# Patient Record
Sex: Female | Born: 1955
Health system: Southern US, Community
[De-identification: ages and names within clinical notes are randomized; demographics above are authoritative.]

## PROBLEM LIST (undated history)

## (undated) DIAGNOSIS — K219 Gastro-esophageal reflux disease without esophagitis: Secondary | ICD-10-CM

## (undated) DIAGNOSIS — G243 Spasmodic torticollis: Secondary | ICD-10-CM

## (undated) DIAGNOSIS — R112 Nausea with vomiting, unspecified: Secondary | ICD-10-CM

## (undated) DIAGNOSIS — Z9889 Other specified postprocedural states: Secondary | ICD-10-CM

## (undated) DIAGNOSIS — E041 Nontoxic single thyroid nodule: Secondary | ICD-10-CM

## (undated) DIAGNOSIS — L709 Acne, unspecified: Secondary | ICD-10-CM

## (undated) DIAGNOSIS — R42 Dizziness and giddiness: Secondary | ICD-10-CM

## (undated) HISTORY — PX: ABDOMINAL HYSTERECTOMY: SHX81

## (undated) HISTORY — DX: Gastro-esophageal reflux disease without esophagitis: K21.9

## (undated) HISTORY — PX: COLONOSCOPY: SHX174

## (undated) HISTORY — PX: MYOMECTOMY: SHX85

## (undated) HISTORY — DX: Dizziness and giddiness: R42

## (undated) HISTORY — DX: Acne, unspecified: L70.9

## (undated) HISTORY — PX: BUNIONECTOMY: SHX129

## (undated) HISTORY — PX: ECTOPIC PREGNANCY SURGERY: SHX613

## (undated) HISTORY — PX: WISDOM TOOTH EXTRACTION: SHX21

---

## 1999-02-25 ENCOUNTER — Other Ambulatory Visit: Admission: RE | Admit: 1999-02-25 | Discharge: 1999-02-25 | Payer: Self-pay | Admitting: Obstetrics and Gynecology

## 1999-09-30 ENCOUNTER — Encounter (INDEPENDENT_AMBULATORY_CARE_PROVIDER_SITE_OTHER): Payer: Self-pay | Admitting: Specialist

## 1999-09-30 ENCOUNTER — Inpatient Hospital Stay (HOSPITAL_COMMUNITY): Admission: RE | Admit: 1999-09-30 | Discharge: 1999-10-02 | Payer: Self-pay | Admitting: Obstetrics and Gynecology

## 2002-01-14 ENCOUNTER — Other Ambulatory Visit: Admission: RE | Admit: 2002-01-14 | Discharge: 2002-01-14 | Payer: Self-pay | Admitting: Obstetrics and Gynecology

## 2004-07-08 ENCOUNTER — Ambulatory Visit: Payer: Self-pay | Admitting: Internal Medicine

## 2005-09-22 ENCOUNTER — Ambulatory Visit: Payer: Self-pay | Admitting: Internal Medicine

## 2005-09-26 ENCOUNTER — Ambulatory Visit: Payer: Self-pay | Admitting: Internal Medicine

## 2006-02-02 ENCOUNTER — Encounter: Admission: RE | Admit: 2006-02-02 | Discharge: 2006-02-02 | Payer: Self-pay | Admitting: Obstetrics and Gynecology

## 2006-05-17 ENCOUNTER — Encounter: Admission: RE | Admit: 2006-05-17 | Discharge: 2006-05-17 | Payer: Self-pay | Admitting: Obstetrics and Gynecology

## 2006-06-19 ENCOUNTER — Ambulatory Visit: Payer: Self-pay | Admitting: Internal Medicine

## 2006-06-22 ENCOUNTER — Ambulatory Visit: Payer: Self-pay | Admitting: Internal Medicine

## 2006-07-20 ENCOUNTER — Ambulatory Visit: Payer: Self-pay | Admitting: Internal Medicine

## 2006-07-29 ENCOUNTER — Encounter: Admission: RE | Admit: 2006-07-29 | Discharge: 2006-07-29 | Payer: Self-pay | Admitting: Internal Medicine

## 2006-08-07 ENCOUNTER — Encounter: Admission: RE | Admit: 2006-08-07 | Discharge: 2006-08-07 | Payer: Self-pay | Admitting: Obstetrics and Gynecology

## 2007-07-22 ENCOUNTER — Ambulatory Visit: Payer: Self-pay | Admitting: Internal Medicine

## 2007-07-22 LAB — CONVERTED CEMR LAB
Albumin: 3.5 g/dL (ref 3.5–5.2)
Alkaline Phosphatase: 38 units/L — ABNORMAL LOW (ref 39–117)
BUN: 6 mg/dL (ref 6–23)
Bacteria, UA: NEGATIVE
Basophils Absolute: 0 10*3/uL (ref 0.0–0.1)
Basophils Relative: 0.2 % (ref 0.0–1.0)
Bilirubin Urine: NEGATIVE
Cholesterol: 195 mg/dL (ref 0–200)
Crystals: NEGATIVE
GFR calc non Af Amer: 81 mL/min
HDL: 76.6 mg/dL (ref >39.0)
Hemoglobin: 13.7 g/dL (ref 12.0–15.0)
LDL Cholesterol: 111 mg/dL — ABNORMAL HIGH (ref 0–99)
MCHC: 34.6 g/dL (ref 30.0–36.0)
Monocytes Absolute: 0.4 10*3/uL (ref 0.2–0.7)
Monocytes Relative: 7.2 % (ref 3.0–11.0)
Nitrite: NEGATIVE
Platelets: 352 10*3/uL (ref 150–400)
Potassium: 4.2 meq/L (ref 3.5–5.1)
RBC / HPF: NONE SEEN
RDW: 12.2 % (ref 11.5–14.6)
Sodium: 147 meq/L — ABNORMAL HIGH (ref 135–145)
Specific Gravity, Urine: 1.01 (ref 1.000–1.03)
TSH: 0.99 microintl units/mL (ref 0.35–5.50)
Total CHOL/HDL Ratio: 2.5
Triglycerides: 38 mg/dL (ref 0–149)
Urine Glucose: NEGATIVE mg/dL
Urobilinogen, UA: 0.2 (ref 0.0–1.0)
VLDL: 8 mg/dL (ref 0–40)
pH: 6.5 (ref 5.0–8.0)

## 2007-07-26 ENCOUNTER — Ambulatory Visit: Payer: Self-pay | Admitting: Internal Medicine

## 2007-07-26 ENCOUNTER — Encounter: Payer: Self-pay | Admitting: Internal Medicine

## 2007-07-26 DIAGNOSIS — L708 Other acne: Secondary | ICD-10-CM | POA: Insufficient documentation

## 2007-07-26 DIAGNOSIS — K219 Gastro-esophageal reflux disease without esophagitis: Secondary | ICD-10-CM

## 2007-07-26 DIAGNOSIS — G4700 Insomnia, unspecified: Secondary | ICD-10-CM | POA: Insufficient documentation

## 2007-08-05 ENCOUNTER — Telehealth: Payer: Self-pay | Admitting: Internal Medicine

## 2007-08-12 ENCOUNTER — Encounter: Admission: RE | Admit: 2007-08-12 | Discharge: 2007-08-12 | Payer: Self-pay | Admitting: Obstetrics and Gynecology

## 2007-11-22 ENCOUNTER — Ambulatory Visit: Payer: Self-pay | Admitting: Internal Medicine

## 2007-11-22 DIAGNOSIS — M542 Cervicalgia: Secondary | ICD-10-CM

## 2007-11-22 DIAGNOSIS — J309 Allergic rhinitis, unspecified: Secondary | ICD-10-CM | POA: Insufficient documentation

## 2007-11-26 ENCOUNTER — Telehealth: Payer: Self-pay | Admitting: Internal Medicine

## 2008-02-18 ENCOUNTER — Encounter: Payer: Self-pay | Admitting: Internal Medicine

## 2008-02-21 ENCOUNTER — Ambulatory Visit: Payer: Self-pay | Admitting: Internal Medicine

## 2008-02-21 ENCOUNTER — Telehealth: Payer: Self-pay | Admitting: Internal Medicine

## 2008-02-21 DIAGNOSIS — R509 Fever, unspecified: Secondary | ICD-10-CM

## 2008-02-21 DIAGNOSIS — M545 Low back pain: Secondary | ICD-10-CM

## 2008-02-21 DIAGNOSIS — R109 Unspecified abdominal pain: Secondary | ICD-10-CM

## 2008-02-26 ENCOUNTER — Ambulatory Visit: Payer: Self-pay | Admitting: Cardiology

## 2008-03-10 LAB — CONVERTED CEMR LAB
ALT: 10 units/L (ref 0–35)
AST: 13 units/L (ref 0–37)
Alkaline Phosphatase: 48 units/L (ref 39–117)
Bilirubin, Direct: 0.1 mg/dL (ref 0.0–0.3)
CO2: 29 meq/L (ref 19–32)
Chloride: 108 meq/L (ref 96–112)
Potassium: 4.2 meq/L (ref 3.5–5.1)
Sodium: 141 meq/L (ref 135–145)
TSH: 0.55 microintl units/mL (ref 0.35–5.50)
Total Bilirubin: 0.8 mg/dL (ref 0.3–1.2)

## 2008-04-21 ENCOUNTER — Telehealth: Payer: Self-pay | Admitting: Internal Medicine

## 2008-04-27 ENCOUNTER — Encounter: Payer: Self-pay | Admitting: Internal Medicine

## 2008-08-12 ENCOUNTER — Encounter: Admission: RE | Admit: 2008-08-12 | Discharge: 2008-08-12 | Payer: Self-pay | Admitting: Obstetrics and Gynecology

## 2008-09-09 ENCOUNTER — Ambulatory Visit: Payer: Self-pay | Admitting: Internal Medicine

## 2008-09-09 DIAGNOSIS — G510 Bell's palsy: Secondary | ICD-10-CM

## 2008-09-10 ENCOUNTER — Telehealth: Payer: Self-pay | Admitting: Family Medicine

## 2008-09-16 ENCOUNTER — Telehealth (INDEPENDENT_AMBULATORY_CARE_PROVIDER_SITE_OTHER): Payer: Self-pay | Admitting: *Deleted

## 2008-09-30 ENCOUNTER — Ambulatory Visit: Payer: Self-pay | Admitting: Internal Medicine

## 2008-09-30 DIAGNOSIS — R209 Unspecified disturbances of skin sensation: Secondary | ICD-10-CM

## 2008-11-27 ENCOUNTER — Ambulatory Visit: Payer: Self-pay | Admitting: Internal Medicine

## 2008-11-27 LAB — CONVERTED CEMR LAB
AST: 17 units/L (ref 0–37)
Alkaline Phosphatase: 46 units/L (ref 39–117)
Basophils Absolute: 0 10*3/uL (ref 0.0–0.1)
Bilirubin, Direct: 0.2 mg/dL (ref 0.0–0.3)
Chloride: 104 meq/L (ref 96–112)
Eosinophils Absolute: 0.1 10*3/uL (ref 0.0–0.7)
Eosinophils Relative: 2.2 % (ref 0.0–5.0)
GFR calc non Af Amer: 80 mL/min
HDL: 94.5 mg/dL (ref >39.0)
Hemoglobin, Urine: NEGATIVE
MCHC: 34.2 g/dL (ref 30.0–36.0)
MCV: 94.8 fL (ref 78.0–100.0)
Neutrophils Relative %: 61.1 % (ref 43.0–77.0)
Platelets: 300 10*3/uL (ref 150–400)
Potassium: 4 meq/L (ref 3.5–5.1)
Total Bilirubin: 0.9 mg/dL (ref 0.3–1.2)
Total CHOL/HDL Ratio: 2.1
Urine Glucose: NEGATIVE mg/dL
Urobilinogen, UA: 0.2 (ref 0.0–1.0)
VLDL: 5 mg/dL (ref 0–40)
Vit D, 25-Hydroxy: 35 ng/mL (ref 30–89)
WBC: 4.9 10*3/uL (ref 4.5–10.5)

## 2008-12-03 ENCOUNTER — Ambulatory Visit: Payer: Self-pay | Admitting: Internal Medicine

## 2009-01-05 ENCOUNTER — Telehealth (INDEPENDENT_AMBULATORY_CARE_PROVIDER_SITE_OTHER): Payer: Self-pay | Admitting: *Deleted

## 2009-01-06 ENCOUNTER — Encounter: Payer: Self-pay | Admitting: Internal Medicine

## 2009-01-08 ENCOUNTER — Encounter: Payer: Self-pay | Admitting: Internal Medicine

## 2009-04-22 ENCOUNTER — Telehealth: Payer: Self-pay | Admitting: Internal Medicine

## 2009-08-31 ENCOUNTER — Encounter: Admission: RE | Admit: 2009-08-31 | Discharge: 2009-08-31 | Payer: Self-pay | Admitting: Obstetrics and Gynecology

## 2009-10-08 ENCOUNTER — Telehealth: Payer: Self-pay | Admitting: Internal Medicine

## 2009-12-13 ENCOUNTER — Ambulatory Visit: Payer: Self-pay | Admitting: Internal Medicine

## 2009-12-13 LAB — CONVERTED CEMR LAB
AST: 17 units/L (ref 0–37)
Basophils Absolute: 0 10*3/uL (ref 0.0–0.1)
CO2: 30 meq/L (ref 19–32)
Chloride: 108 meq/L (ref 96–112)
Eosinophils Absolute: 0.1 10*3/uL (ref 0.0–0.7)
HCT: 39.8 % (ref 36.0–46.0)
HDL: 89.3 mg/dL (ref >39.00)
Ketones, ur: NEGATIVE mg/dL
LDL Cholesterol: 91 mg/dL (ref 0–99)
Leukocytes, UA: NEGATIVE
Lymphocytes Relative: 32.9 % (ref 12.0–46.0)
Lymphs Abs: 1.5 10*3/uL (ref 0.7–4.0)
MCHC: 33.6 g/dL (ref 30.0–36.0)
Monocytes Relative: 6.4 % (ref 3.0–12.0)
Nitrite: NEGATIVE
Platelets: 248 10*3/uL (ref 150.0–400.0)
RDW: 11.3 % — ABNORMAL LOW (ref 11.5–14.6)
Sodium: 140 meq/L (ref 135–145)
Specific Gravity, Urine: 1.025 (ref 1.000–1.030)
TSH: 0.92 microintl units/mL (ref 0.35–5.50)
Total Bilirubin: 0.8 mg/dL (ref 0.3–1.2)
Total CHOL/HDL Ratio: 2
Triglycerides: 40 mg/dL (ref 0.0–149.0)
Urobilinogen, UA: 0.2 (ref 0.0–1.0)
pH: 5.5 (ref 5.0–8.0)

## 2009-12-16 ENCOUNTER — Ambulatory Visit: Payer: Self-pay | Admitting: Internal Medicine

## 2009-12-22 ENCOUNTER — Encounter: Admission: RE | Admit: 2009-12-22 | Discharge: 2009-12-22 | Payer: Self-pay | Admitting: Internal Medicine

## 2010-07-06 ENCOUNTER — Telehealth: Payer: Self-pay | Admitting: Internal Medicine

## 2010-09-09 ENCOUNTER — Ambulatory Visit: Payer: Self-pay | Admitting: Internal Medicine

## 2010-09-09 ENCOUNTER — Encounter
Admission: RE | Admit: 2010-09-09 | Discharge: 2010-09-09 | Payer: Self-pay | Source: Home / Self Care | Attending: Obstetrics and Gynecology | Admitting: Obstetrics and Gynecology

## 2010-09-09 DIAGNOSIS — R5383 Other fatigue: Secondary | ICD-10-CM

## 2010-09-09 DIAGNOSIS — R5381 Other malaise: Secondary | ICD-10-CM

## 2010-09-09 DIAGNOSIS — R42 Dizziness and giddiness: Secondary | ICD-10-CM

## 2010-09-14 LAB — CONVERTED CEMR LAB
AST: 16 units/L (ref 0–37)
Albumin: 3.9 g/dL (ref 3.5–5.2)
Alkaline Phosphatase: 54 units/L (ref 39–117)
BUN: 12 mg/dL (ref 6–23)
Basophils Absolute: 0 10*3/uL (ref 0.0–0.1)
Blood, UA: NEGATIVE
CO2: 32 meq/L (ref 19–32)
Calcium: 9.4 mg/dL (ref 8.4–10.5)
Creatinine, Ser: 0.7 mg/dL (ref 0.4–1.2)
Eosinophils Absolute: 0.1 10*3/uL (ref 0.0–0.7)
GFR calc non Af Amer: 105.63 mL/min (ref >60.00)
Glucose, Bld: 89 mg/dL (ref 70–99)
Lymphocytes Relative: 37.1 % (ref 12.0–46.0)
MCHC: 34.3 g/dL (ref 30.0–36.0)
Monocytes Relative: 7.5 % (ref 3.0–12.0)
Nitrite: NEGATIVE
Platelets: 294 10*3/uL (ref 150.0–400.0)
RDW: 12.9 % (ref 11.5–14.6)
Sed Rate: 6 mm/hr (ref 0–22)
Specific Gravity, Urine: <=1.005 (ref 1.000–1.030)
Total Bilirubin: 0.8 mg/dL (ref 0.3–1.2)
Total CK: 115 units/L (ref 7–177)
Total Protein, Urine: NEGATIVE mg/dL
Urine Glucose: NEGATIVE mg/dL
Urobilinogen, UA: 0.2 (ref 0.0–1.0)

## 2010-09-16 ENCOUNTER — Encounter
Admission: RE | Admit: 2010-09-16 | Discharge: 2010-09-16 | Payer: Self-pay | Source: Home / Self Care | Attending: Obstetrics and Gynecology | Admitting: Obstetrics and Gynecology

## 2010-10-25 NOTE — Progress Notes (Signed)
Summary: Rf Ambien  Phone Note Refill Request Message from:  Fax from Pharmacy  Refills Requested: Medication #1:  ZOLPIDEM TARTRATE 10 MG  TABS 1/2 or 1 by mouth at hs prn   Dosage confirmed as above?Dosage Confirmed   Supply Requested: 30   Last Refilled: 06/10/2010  Method Requested: Telephone to Pharmacy Next Appointment Scheduled: None Initial call taken by: Lanier Prude, [facility]),  July 06, 2010 8:01 AM  Follow-up for Phone Call        ok 6 ref Follow-up by: Tresa Garter MD,  July 06, 2010 11:10 PM  Additional Follow-up for Phone Call Additional follow up Details #1::        Unable to call in at this time, pharm # not picking up.........Marland KitchenLamar Sprinkles, CMA  July 07, 2010 9:37 AM     Prescriptions: ZOLPIDEM TARTRATE 10 MG  TABS (ZOLPIDEM TARTRATE) 1/2 or 1 by mouth at hs prn  #30 x 5   Entered by:   Lamar Sprinkles, CMA   Authorized by:   Tresa Garter MD   Signed by:   Lamar Sprinkles, CMA on 07/07/2010   Method used:   Telephoned to ...       [facility] [phone][facility] (retail)       [address]       [place], [place]  [address]       Ph: [phone]       Fax: [phone]   RxID:   [id number]

## 2010-10-25 NOTE — Progress Notes (Signed)
Summary: zolpidem  Phone Note From Pharmacy Call back at fax   Caller: [facility] [phone]* Details for Reason: refill on zolpidem Details of Action Taken: ok x 2 refills  will fax to pharmacy/vg Initial call taken by: Tora Perches,  October 08, 2009 4:38 PM    Prescriptions: ZOLPIDEM TARTRATE 10 MG  TABS (ZOLPIDEM TARTRATE) 1/2 or 1 by mouth at hs prn  #30 x 2   Entered by:   Tora Perches   Authorized by:   Tresa Garter MD   Signed by:   Tora Perches on 10/08/2009   Method used:   Telephoned to ...       [facility] [phone]* (retail)       [address]       [place], [place]  [address]       Ph: [phone]       Fax: [phone]   RxID:   [id number]

## 2010-10-25 NOTE — Assessment & Plan Note (Signed)
Summary: CPX / [employer] / # / CD   Vital Signs:  Patient profile:   55 year old female Height:      64 inches Weight:      142 pounds BMI:     24.46 Temp:     97.4 degrees F oral Pulse rate:   94 / minute BP sitting:   106 / 72  (left arm)  Vitals Entered By: Tora Perches (December 16, 2009 8:33 AM) CC: cpx Is Patient Diabetic? No   CC:  cpx.  History of Present Illness: The patient presents for a wellness examination C/o pain in RUQ and R flank x 2 wks off and on before, constant over the wknd - dull ache  Preventive Screening-Counseling & Management  Alcohol-Tobacco     Smoking Status: never  Current Medications (verified): 1)  Zolpidem Tartrate 10 Mg  Tabs (Zolpidem Tartrate) .... 1/2 or 1 By Mouth At [facility] Prn 2)  Zyrtec Allergy 10 Mg Tabs (Cetirizine Hcl) 3)  Antivert 12.5 Mg Tabs (Meclizine Hcl) .Marland Kitchen.. 1-2 By Mouth Three Times A Day As Needed Vertigo 4)  Vitamin D3 1000 Unit  Tabs (Cholecalciferol) .Marland Kitchen.. 1 By Mouth Daily 5)  Vivelle-Dot 0.075 Mg/24hr Pttw (Estradiol) .... Two Times A Day 6)  Finacea 15 % Gel (Azelaic Acid) .Marland Kitchen.. 1-2 Times A Day 7)  Claritin Otc [facility] 8)  Suprema Bophilus .... Once Daily 9)  Benadryl 25 Mg Tabs (Diphenhydramine Hcl) .... Once Daily 10)  Finacea 15 % Gel (Azelaic Acid) .... As Needed  Allergies: 1)  ! Sulfa 2)  ! Zithromax (Azithromycin)  Past History:  Past Medical History: Last updated: 11/22/2007 GERD Acne chronic vertigo Allergic rhinitis  Past Surgical History: Last updated: 12/03/2008 Hysterectomy partial  Family History: Last updated: 02/21/2008 Family History Hypertension sister with brain tumor father with DM grandmother with pancreatic cancer M died 45 with throat CA  Social History: Last updated: 09/09/2008 Occupation: nursing Married Never Smoked Alcohol use-no Drug use-no Regular exercise-yes  Review of Systems       The patient complains of abdominal pain.  The patient denies anorexia, fever, weight  loss, weight gain, vision loss, decreased hearing, hoarseness, chest pain, syncope, dyspnea on exertion, peripheral edema, prolonged cough, headaches, hemoptysis, melena, hematochezia, severe indigestion/heartburn, hematuria, incontinence, genital sores, muscle weakness, suspicious skin lesions, transient blindness, difficulty walking, depression, unusual weight change, abnormal bleeding, enlarged lymph nodes, angioedema, and breast masses.    Physical Exam  General:  Well-developed,well-nourished,in no acute distress; alert,appropriate and cooperative throughout examination Head:  Normocephalic and atraumatic without obvious abnormalities. No apparent alopecia or balding. Eyes:  No corneal or conjunctival inflammation noted. EOMI. Perrla. Ears:  External ear exam shows no significant lesions or deformities.  Otoscopic examination reveals clear canals, tympanic membranes are intact bilaterally without bulging, retraction, inflammation or discharge. Hearing is grossly normal bilaterally. Nose:  External nasal examination shows no deformity or inflammation. Nasal mucosa are pink and moist without lesions or exudates. Mouth:  Oral mucosa and oropharynx without lesions or exudates.  Teeth in good repair. Neck:  No deformities, masses, or tenderness noted. Lungs:  Normal respiratory effort, chest expands symmetrically. Lungs are clear to auscultation, no crackles or wheezes. Heart:  Normal rate and regular rhythm. S1 and S2 normal without gallop, murmur, click, rub or other extra sounds. Abdomen:  soft, non-tender, normal bowel sounds, no distention, no masses, no guarding, no rigidity, no hepatomegaly, and no splenomegaly.   Msk:  normal ROM, no joint tenderness, no joint swelling,  and no joint warmth.   Pulses:  R and L carotid,radial,femoral,dorsalis pedis and posterior tibial pulses are full and equal bilaterally Extremities:  No clubbing, cyanosis, edema, or deformity noted with normal full range of  motion of all joints.   Neurologic:  No cranial nerve deficits noted. Station and gait are normal. Plantar reflexes are down-going bilaterally. DTRs are symmetrical throughout. Sensory, motor and coordinative functions appear intact. Skin:  Intact without suspicious lesions or rashes Cervical Nodes:  No lymphadenopathy noted Inguinal Nodes:  No significant adenopathy Psych:  Cognition and judgment appear intact. Alert and cooperative with normal attention span and concentration. No apparent delusions, illusions, hallucinations   Impression & Recommendations:  Problem # 1:  WELL ADULT EXAM (ICD-V70.0) Assessment New Health and age related issues were discussed. Available screening tests and vaccinations were discussed as well. Healthy life style including good diet and execise was discussed.  The labs were reviewed with the patient.  Orders: EKG w/ Interpretation (93000) nl GYN q 12 months   Problem # 2:  LOW BACK PAIN (ICD-724.2) Assessment: New  The following medications were removed from the medication list:    Baby Aspirin 81 Mg Chew (Aspirin) ..... Once daily  Problem # 3:  ABDOMINAL PAIN (ICD-789.00) Assessment: New  Orders: Radiology Referral (Radiology) [place] The abd CT from 2009 was reviewed with the patient.   Problem # 4:  INSOMNIA, PERSISTENT (ICD-307.42) Assessment: Unchanged On prescription/OTC  therapy   Complete Medication List: 1)  Zolpidem Tartrate 10 Mg Tabs (Zolpidem tartrate) .... 1/2 or 1 by mouth at hs prn 2)  Zyrtec Allergy 10 Mg Tabs (Cetirizine hcl) 3)  Antivert 12.5 Mg Tabs (Meclizine hcl) .Marland Kitchen.. 1-2 by mouth three times a day as needed vertigo 4)  Vitamin D3 1000 Unit Tabs (Cholecalciferol) .Marland Kitchen.. 1 by mouth daily 5)  Vivelle-dot 0.075 Mg/24hr Pttw (Estradiol) .... Two times a day 6)  Finacea 15 % Gel (Azelaic acid) .Marland Kitchen.. 1-2 times a day 7)  Claritin Otc [facility]  8)  Suprema Bophilus  .... Once daily 9)  Benadryl 25 Mg Tabs (Diphenhydramine hcl) ....  Once daily 10)  Finacea 15 % Gel (Azelaic acid) .... As needed 11)  Ciprofloxacin Hcl 500 Mg Tabs (Ciprofloxacin hcl) .Marland Kitchen.. 1 by mouth bid  Patient Instructions: 1)  Please schedule a follow-up appointment in 1 year. 2)  Try to eat more raw plant food, fresh and dry fruit, raw almonds, leafy vegetables, whole foods and less red meat, less animal fat. Poultry and fish is better for you than pork and beef. Avoid processed foods (canned soups, hot dogs, sausage, bacon , frozen dinners). Avoid corn syrup, high fructose syrup or aspartam and Splenda  containing drinks. Honey, Agave and Stevia are better sweeteners. Make your own  dressing with olive oil, wine vinegar, lemon juce, garlic etc. for your salads.  Prescriptions: CIPROFLOXACIN HCL 500 MG TABS (CIPROFLOXACIN HCL) 1 by mouth bid  #20 x 1   Entered and Authorized by:   Tresa Garter MD   Signed by:   Tresa Garter MD on 12/16/2009   Method used:   Print then Give to Patient   RxID:   [id number] ZOLPIDEM TARTRATE 10 MG  TABS (ZOLPIDEM TARTRATE) 1/2 or 1 by mouth at hs prn  #30 x 6   Entered and Authorized by:   Tresa Garter MD   Signed by:   Tresa Garter MD on 12/16/2009   Method used:   Print then Give to Patient  RxID:   [id number]

## 2010-10-27 NOTE — Assessment & Plan Note (Signed)
Summary: dizziness and joint weakness-lb   Vital Signs:  Patient profile:   55 year old female Height:      64 inches Weight:      140 pounds BMI:     24.12 Temp:     98.5 degrees F oral Pulse rate:   68 / minute Pulse rhythm:   regular Resp:     16 per minute BP sitting:   120 / 88  (left arm) Cuff size:   regular  Vitals Entered By: Lanier Prude, Beverly Gust) (September 09, 2010 4:20 PM) CC: dizziness X 1 mo and legs "feel like lead" Is Patient Diabetic? No   CC:  dizziness X 1 mo and legs "feel like lead".  History of Present Illness: C/o vertigo worse with turning her head C/o legs feel like lead x 2 wks  Current Medications (verified): 1)  Zolpidem Tartrate 10 Mg  Tabs (Zolpidem Tartrate) .... 1/2 or 1 By Mouth At [facility] Prn 2)  Zyrtec Allergy 10 Mg Tabs (Cetirizine Hcl) 3)  Antivert 12.5 Mg Tabs (Meclizine Hcl) .Marland Kitchen.. 1-2 By Mouth Three Times A Day As Needed Vertigo 4)  Vitamin D3 1000 Unit  Tabs (Cholecalciferol) .Marland Kitchen.. 1 By Mouth Daily 5)  Vivelle-Dot 0.075 Mg/24hr Pttw (Estradiol) .... Two Times A Day 6)  Finacea 15 % Gel (Azelaic Acid) .Marland Kitchen.. 1-2 Times A Day 7)  Claritin Otc [facility] 8)  Suprema Bophilus .... Once Daily 9)  Benadryl 25 Mg Tabs (Diphenhydramine Hcl) .... Once Daily 10)  Finacea 15 % Gel (Azelaic Acid) .... As Needed  Allergies (verified): 1)  ! Sulfa 2)  ! 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    Physical Exam  General:  Well-developed,well-nourished,in no acute distress; alert,appropriate and cooperative throughout examination Eyes:   No corneal or conjunctival inflammation noted. EOMI. Perrla. Nose:  External nasal examination shows no deformity or inflammation. Nasal mucosa are pink and moist without lesions or exudates. Mouth:  Oral mucosa and oropharynx without lesions or exudates.  Teeth in good repair. Lungs:  Normal respiratory effort, chest expands symmetrically. Lungs are clear to auscultation, no crackles or wheezes. Heart:  Normal rate and regular rhythm. S1 and S2 normal without gallop, murmur, click, rub or other extra sounds. Abdomen:  soft, non-tender, normal bowel sounds, no distention, no masses, no guarding, no rigidity, no hepatomegaly, and no splenomegaly.   Msk:  normal ROM, no joint tenderness, no joint swelling, and no joint warmth.   Neurologic:  No cranial nerve deficits noted. Station and gait are normal. Plantar reflexes are down-going bilaterally. DTRs are symmetrical throughout. Sensory, motor and coordinative functions appear intact. H-P (+) on L Skin:  Intact without suspicious lesions or rashes Psych:  Cognition and judgment appear intact. Alert and cooperative with normal attention span and concentration. No apparent delusions, illusions, hallucinations   Impression & Recommendations:  Problem # 1:  WEAKNESS (ICD-780.79) ? etiol Assessment New  Orders: TLB-B12, Serum-Total ONLY (04540-J81) TLB-BMP (Basic Metabolic Panel-BMET) (80048-METABOL) TLB-CBC Platelet - w/Differential (85025-CBCD) TLB-Hepatic/Liver Function Pnl (80076-HEPATIC) TLB-Sedimentation Rate (ESR) (85652-ESR) TLB-TSH (Thyroid Stimulating Hormone) (84443-TSH) TLB-Udip ONLY (81003-UDIP) TLB-CK Total Only(Creatine Kinase/CPK) (82550-CK)  Problem # 2:  VERTIGO (ICD-780.4) BPV Assessment:  Allayne Stack - Daroff exercise was given to the patient  Her updated medication list for this problem includes:    Zyrtec Allergy 10 Mg Tabs (Cetirizine hcl)    Antivert 12.5 Mg Tabs (Meclizine hcl) .Marland Kitchen... 1-2 by mouth three times a day as  needed vertigo    Benadryl 25 Mg Tabs (Diphenhydramine hcl) ..... Once daily  Problem # 3:  PARESTHESIA (ICD-782.0) Assessment: Improved Resolved  Complete Medication List: 1)  Zolpidem Tartrate 10 Mg Tabs (Zolpidem tartrate) .... 1/2 or 1 by mouth at hs prn 2)  Zyrtec Allergy 10 Mg Tabs (Cetirizine hcl) 3)  Antivert 12.5 Mg Tabs (Meclizine hcl) .Marland Kitchen.. 1-2 by mouth three times a day as needed vertigo 4)  Vitamin D3 1000 Unit Tabs (Cholecalciferol) .Marland Kitchen.. 1 by mouth daily 5)  Vivelle-dot 0.075 Mg/24hr Pttw (Estradiol) .... Two times a day 6)  Finacea 15 % Gel (Azelaic acid) .Marland Kitchen.. 1-2 times a day 7)  Claritin Otc [facility]  8)  Suprema Bophilus  .... Once daily 9)  Benadryl 25 Mg Tabs (Diphenhydramine hcl) .... Once daily 10)  Finacea 15 % Gel (Azelaic acid) .... As needed  Patient Instructions: 1)  Francee Piccolo - Daroff exercise  2)  Call if you are not better in a reasonable amount of time or if worse.  Prescriptions: ANTIVERT 12.5 MG TABS (MECLIZINE HCL) 1-2 by mouth three times a day as needed vertigo  #60 x 1   Entered and Authorized by:   Tresa Garter MD   Signed by:   Tresa Garter MD on 09/09/2010   Method used:   Electronically to        [facility] [phone]* (retail)       [address]       [place], [place]  [address]       Ph: [phone]       Fax: [phone]   RxID:   [id number]    Orders Added: 1)  TLB-B12, Serum-Total ONLY [82607-B12] 2)  TLB-BMP (Basic Metabolic Panel-BMET) [80048-METABOL] 3)  TLB-CBC Platelet - w/Differential [85025-CBCD] 4)  TLB-Hepatic/Liver Function Pnl [80076-HEPATIC] 5)  TLB-Sedimentation Rate (ESR) [85652-ESR] 6)  TLB-TSH (Thyroid Stimulating Hormone) [84443-TSH] 7)  TLB-Udip ONLY [81003-UDIP] 8)  TLB-CK Total Only(Creatine Kinase/CPK) [82550-CK] 9)  Est. Patient Level IV [24401]

## 2010-11-01 ENCOUNTER — Ambulatory Visit (INDEPENDENT_AMBULATORY_CARE_PROVIDER_SITE_OTHER): Payer: Managed Care, Other (non HMO) | Admitting: Internal Medicine

## 2010-11-01 ENCOUNTER — Encounter: Payer: Self-pay | Admitting: Internal Medicine

## 2010-11-01 DIAGNOSIS — R079 Chest pain, unspecified: Secondary | ICD-10-CM

## 2010-11-01 DIAGNOSIS — M940 Chondrocostal junction syndrome [Tietze]: Secondary | ICD-10-CM

## 2010-11-01 DIAGNOSIS — Z136 Encounter for screening for cardiovascular disorders: Secondary | ICD-10-CM

## 2010-11-10 NOTE — Assessment & Plan Note (Signed)
Summary: rib pain/plot pt   Vital Signs:  Patient profile:   55 year old female Height:      64 inches (162.56 cm) O2 Sat:      98 % on Room air Temp:     98.6 degrees F (37.00 degrees C) oral Pulse rate:   63 / minute BP sitting:   120 / 72  (left arm) Cuff size:   regular  Vitals Entered By: Orlan Leavens RMA (November 01, 2010 1:19 PM)  O2 Flow:  Room air CC: Rib pain Is Patient Diabetic? No Pain Assessment Patient in pain? yes     Location: rib area Type: aching   Primary Care Provider:  Tresa Garter MD  CC:  Rib pain.  History of Present Illness:       This is a 55 year old female who presents with Chest pain.  The symptoms began 4 weeks ago.  On a scale of 1 to 10, the intensity is described as a 5.  no prior cardiac history.  The patient reports resting chest pain and exertional chest pain, but denies nausea, vomiting, diaphoresis, shortness of breath, palpitations, dizziness, light headedness, syncope, and indigestion.  The pain is described as constant and sharp.  The pain is located in the left anterior chest and the pain does not radiate.  Episodes of chest pain last >30 minutes.  The pain is brought on or made worse by upper body movement.  The pain is relieved or improved with NSAIDs and change in position.  +history similar pain when dx yeasr ago with costochondritits.  Clinical Review Panels:  Lipid Management   Cholesterol:  188 (12/13/2009)   LDL (bad choesterol):  91 (12/13/2009)   HDL (good cholesterol):  89.30 (12/13/2009)  CBC   WBC:  6.0 (09/09/2010)   RBC:  4.19 (09/09/2010)   Hgb:  13.6 (09/09/2010)   Hct:  39.7 (09/09/2010)   Platelets:  294.0 (09/09/2010)   MCV  94.9 (09/09/2010)   MCHC  34.3 (09/09/2010)   RDW  12.9 (09/09/2010)   PMN:  52.4 (09/09/2010)   Lymphs:  37.1 (09/09/2010)   Monos:  7.5 (09/09/2010)   Eosinophils:  2.5 (09/09/2010)   Basophil:  0.5 (09/09/2010)  Complete Metabolic Panel   Glucose:  89 (09/09/2010)  Sodium:  139 (09/09/2010)   Potassium:  3.8 (09/09/2010)   Chloride:  102 (09/09/2010)   CO2:  32 (09/09/2010)   BUN:  12 (09/09/2010)   Creatinine:  0.7 (09/09/2010)   Albumin:  3.9 (09/09/2010)   Total Protein:  6.4 (09/09/2010)   Calcium:  9.4 (09/09/2010)   Total Bili:  0.8 (09/09/2010)   Alk Phos:  54 (09/09/2010)   SGPT (ALT):  16 (09/09/2010)   SGOT (AST):  16 (09/09/2010)   Current Medications (verified): 1)  Zolpidem Tartrate 10 Mg  Tabs (Zolpidem Tartrate) .... 1/2 or 1 By Mouth At [facility] Prn 2)  Zyrtec Allergy 10 Mg Tabs (Cetirizine Hcl) 3)  Antivert 12.5 Mg Tabs (Meclizine Hcl) .Marland Kitchen.. 1-2 By Mouth Three Times A Day As Needed Vertigo 4)  Vitamin D3 1000 Unit  Tabs (Cholecalciferol) .Marland Kitchen.. 1 By Mouth Daily 5)  Vivelle-Dot 0.075 Mg/24hr Pttw (Estradiol) .... Two Times A Day 6)  Finacea 15 % Gel (Azelaic Acid) .Marland Kitchen.. 1-2 Times A Day 7)  Claritin Otc [facility] 8)  Suprema Bophilus .... Once Daily 9)  Benadryl 25 Mg Tabs (Diphenhydramine Hcl) .... Once Daily As Needed  Allergies (verified): 1)  ! Sulfa 2)  !  Zithromax (Azithromycin)  Past History:  Past Medical History: GERD Acne chronic vertigo Allergic rhinitis   Review of Systems  The patient denies fever, weight loss, syncope, dyspnea on exertion, peripheral edema, headaches, abdominal pain, and severe indigestion/heartburn.    Physical Exam  General:  alert, well-developed, well-nourished, and cooperative to examination.   nontoxic Eyes:  vision grossly intact; pupils equal, round and reactive to light.  conjunctiva and lids normal.    Neck:  supple, full ROM, no masses, no thyromegaly; no thyroid nodules or tenderness. no JVD or carotid bruits.   Chest Wall:  tender along left costal margin to palp Lungs:  normal respiratory effort, no intercostal retractions or use of accessory muscles; normal breath sounds bilaterally - no crackles and no wheezes.    Heart:  normal rate, regular rhythm, no murmur, and no rub. BLE  without edema.  Skin:  no rashes, vesicles, ulcers, or erythema. No nodules or irregularity to palpation.    Impression & Recommendations:  Problem # 1:  CHEST PAIN (ICD-786.50)  suspect costochondritis on exam - EKG today for reassurance - NSR, no ischemia tx 7 day NSAIDS - diclofenac erx done if cont symptoms s/p tx, to call for cxr or other eval, call sooner if worse - pt understands and agrees  Orders: EKG w/ Interpretation (93000)  Problem # 2:  COSTOCHONDRITIS (ICD-733.6)  Discussed medication use, applications of heat or ice, and exercises.   Orders: Prescription Created Electronically [phone])  Complete Medication List: 1)  Zolpidem Tartrate 10 Mg Tabs (Zolpidem tartrate) .... 1/2 or 1 by mouth at hs prn 2)  Zyrtec Allergy 10 Mg Tabs (Cetirizine hcl) 3)  Antivert 12.5 Mg Tabs (Meclizine hcl) .Marland Kitchen.. 1-2 by mouth three times a day as needed vertigo 4)  Vitamin D3 1000 Unit Tabs (Cholecalciferol) .Marland Kitchen.. 1 by mouth daily 5)  Vivelle-dot 0.075 Mg/24hr Pttw (Estradiol) .... Two times a day 6)  Finacea 15 % Gel (Azelaic acid) .Marland Kitchen.. 1-2 times a day 7)  Claritin Otc [facility]  8)  Suprema Bophilus  .... Once daily 9)  Benadryl 25 Mg Tabs (Diphenhydramine hcl) .... Once daily as needed 10)  Diclofenac Sodium 75 Mg Tbec (Diclofenac sodium) .Marland Kitchen.. 1 by mouth two times a day x 7 days, then as needed for pain  Patient Instructions: 1)  it was good to see you today. 2)  exam consistent with costochondritis - EKG today shows no cardiac abnormalities -  3)  treat with antiinflammatory scheduled as discussed - diclofenac - take with food - your prescription has been electronically submitted to your pharmacy. Please take as directed. Contact our office if you believe you're having problems with the medication(s).  4)  Return in 7-10 days if you're not better:call sooner if you're feeling worse. Prescriptions: DICLOFENAC SODIUM 75 MG TBEC (DICLOFENAC SODIUM) 1 by mouth two times a day x 7 days,  then as needed for pain  #20 x 0   Entered and Authorized by:   Newt Lukes MD   Signed by:   Newt Lukes MD on 11/01/2010   Method used:   Electronically to        [facility] [phone]* (retail)       [address]       [place], [place]  [address]       Ph: [phone]       Fax: [phone]   RxID:   [id number]  Orders Added: 1)  Est. Patient Level IV [91478] 2)  Prescription Created Electronically [G8553] 3)  EKG w/ Interpretation [93000]

## 2011-01-10 ENCOUNTER — Telehealth: Payer: Self-pay | Admitting: Internal Medicine

## 2011-01-10 NOTE — Telephone Encounter (Signed)
OK to fill this prescription with additional refills x3 Thank you!  

## 2011-01-10 NOTE — Telephone Encounter (Signed)
Needing Rx for [generic Ambien] Zolpidem [phone]

## 2011-01-13 MED ORDER — ZOLPIDEM TARTRATE 10 MG PO TABS: 5.0000 mg | ORAL_TABLET | Freq: Every evening | ORAL | Status: DC | PRN

## 2011-01-13 NOTE — Telephone Encounter (Signed)
Rx Done . 

## 2011-02-07 ENCOUNTER — Other Ambulatory Visit: Payer: Self-pay | Admitting: Internal Medicine

## 2011-02-10 NOTE — Assessment & Plan Note (Signed)
[facility]                             PRIMARY CARE OFFICE NOTE   NAME:May May MRUK                        MRN:          [id number]  DATE:06/22/2006                            DOB:          12-01-1955    The patient is a 55 year old female who presents for a wellness examination.   ALLERGIES:  SULFA.   PAST MEDICAL HISTORY, FAMILY HISTORY, SOCIAL HISTORY:  As per 07/14/2004  note.  Had a left bunion surgery recently, successful.   CURRENT MEDICATIONS:  Reviewed with the patient.   REVIEW OF SYSTEMS:  Negative for chest pain, shortness of breath.  No  syncope.  No neurological complaints.  Not depressed, occasional trouble  sleeping the rest is negative.   PHYSICAL EXAMINATION:  Blood pressure 116/72, pulse 66, temperature 98.4,  weight 142 pounds.  Looks well.  HEENT:  Moist mucosa.  NECK:  Supple.  No thyromegaly or bruits.  LUNGS:  Clear to auscultation and percussion.  No wheeze or rales.  HEART:  S1-S2 no murmurs no gallops.  ABDOMEN:  Soft, nontender, without __________.  EXTREMITIES:  No edema.  NEUROLOGIC:  She is alert and appropriate, denies any depression.  SKIN:  Clear.   LABS:  On 01/17/2006 CBC normal.  CMET normal.  Cholesterol 165.  LDL 103.  TSH normal.  Urinalysis normal.   ASSESSMENT AND PLAN:  1. Normal wellness examination. Age/health related issues discussed.      Healthy lifestyle discussed.  Regular GYN care with Dr. Rosalio May.      Calcium and vitamin D. To      continue with mammogram once every 1-2 years.  2. Insomnia.  Ambien 10 mg at h.s. p.r.n. #30 with 6 refills.            ______________________________  Georgina Quint Plotnikov, MD      AVP/MedQ  DD:  06/28/2006  DT:  06/29/2006  Job #:  [id number]   cc:   May May, M.D.

## 2011-03-22 ENCOUNTER — Telehealth: Payer: Self-pay | Admitting: *Deleted

## 2011-03-22 NOTE — Telephone Encounter (Signed)
Pt was seen 10/2010 w/Dr Felicity Coyer for rib pain. Pain has continued and she would like xray w/o OV. Please advise.

## 2011-03-22 NOTE — Telephone Encounter (Signed)
Ok CXR OV if cont sx Thx

## 2011-03-23 ENCOUNTER — Ambulatory Visit (INDEPENDENT_AMBULATORY_CARE_PROVIDER_SITE_OTHER)
Admission: RE | Admit: 2011-03-23 | Discharge: 2011-03-23 | Disposition: A | Discharge status: Home / Self Care | Payer: Managed Care, Other (non HMO) | Source: Ambulatory Visit | Attending: Internal Medicine | Admitting: Internal Medicine

## 2011-03-23 DIAGNOSIS — R079 Chest pain, unspecified: Secondary | ICD-10-CM

## 2011-03-23 NOTE — Telephone Encounter (Signed)
Patient informed - order entered.

## 2011-03-23 NOTE — Telephone Encounter (Signed)
Left mess to call office back.   

## 2011-03-24 ENCOUNTER — Telehealth: Payer: Self-pay | Admitting: Internal Medicine

## 2011-03-24 NOTE — Telephone Encounter (Signed)
Tracy May, please, inform patient that her CXR was nl OV if sick Thx

## 2011-03-27 NOTE — Telephone Encounter (Signed)
Pt informed

## 2011-07-28 ENCOUNTER — Other Ambulatory Visit: Payer: Self-pay | Admitting: *Deleted

## 2011-07-28 NOTE — Telephone Encounter (Signed)
Last seen 09/09/10, last filled 01/13/11 #30 w/5 RF.  Is this OK to fill?  Please advise.

## 2011-08-01 ENCOUNTER — Telehealth: Payer: Self-pay | Admitting: *Deleted

## 2011-08-01 MED ORDER — ZOLPIDEM TARTRATE 10 MG PO TABS: 5.0000 mg | ORAL_TABLET | Freq: Every evening | ORAL | Status: DC | PRN

## 2011-08-01 NOTE — Telephone Encounter (Signed)
error 

## 2011-08-25 ENCOUNTER — Other Ambulatory Visit: Payer: Self-pay | Admitting: Obstetrics and Gynecology

## 2011-08-25 DIAGNOSIS — Z1231 Encounter for screening mammogram for malignant neoplasm of breast: Secondary | ICD-10-CM

## 2011-09-15 ENCOUNTER — Other Ambulatory Visit (INDEPENDENT_AMBULATORY_CARE_PROVIDER_SITE_OTHER): Payer: Managed Care, Other (non HMO)

## 2011-09-15 ENCOUNTER — Other Ambulatory Visit: Payer: Self-pay | Admitting: Internal Medicine

## 2011-09-15 ENCOUNTER — Encounter: Payer: Self-pay | Admitting: Internal Medicine

## 2011-09-15 ENCOUNTER — Other Ambulatory Visit: Payer: Self-pay | Admitting: *Deleted

## 2011-09-15 DIAGNOSIS — Z0000 Encounter for general adult medical examination without abnormal findings: Secondary | ICD-10-CM

## 2011-09-15 LAB — CBC WITH DIFFERENTIAL/PLATELET
Basophils Absolute: 0 10*3/uL (ref 0.0–0.1)
Eosinophils Absolute: 0.1 10*3/uL (ref 0.0–0.7)
Lymphocytes Relative: 34.9 % (ref 12.0–46.0)
MCHC: 34.2 g/dL (ref 30.0–36.0)
Neutrophils Relative %: 55.2 % (ref 43.0–77.0)
RBC: 4.26 Mil/uL (ref 3.87–5.11)
RDW: 12.9 % (ref 11.5–14.6)

## 2011-09-15 LAB — LIPID PANEL
HDL: 106.6 mg/dL (ref >39.00)
Triglycerides: 36 mg/dL (ref 0.0–149.0)

## 2011-09-15 LAB — URINALYSIS, ROUTINE W REFLEX MICROSCOPIC
Bilirubin Urine: NEGATIVE
Leukocytes, UA: NEGATIVE
Nitrite: NEGATIVE
Specific Gravity, Urine: 1.015 (ref 1.000–1.030)
pH: 7.5 (ref 5.0–8.0)

## 2011-09-15 LAB — HEPATIC FUNCTION PANEL: Albumin: 4 g/dL (ref 3.5–5.2)

## 2011-09-15 LAB — BASIC METABOLIC PANEL
BUN: 14 mg/dL (ref 6–23)
Calcium: 9.1 mg/dL (ref 8.4–10.5)
GFR: 92.17 mL/min (ref >60.00)
Glucose, Bld: 86 mg/dL (ref 70–99)

## 2011-09-20 ENCOUNTER — Ambulatory Visit (INDEPENDENT_AMBULATORY_CARE_PROVIDER_SITE_OTHER): Payer: Managed Care, Other (non HMO) | Admitting: Internal Medicine

## 2011-09-20 ENCOUNTER — Ambulatory Visit: Payer: Managed Care, Other (non HMO)

## 2011-09-20 ENCOUNTER — Encounter: Payer: Self-pay | Admitting: Internal Medicine

## 2011-09-20 VITALS — BP 110/80 | Temp 98.3°F | Wt 151.0 lb

## 2011-09-20 DIAGNOSIS — Z0000 Encounter for general adult medical examination without abnormal findings: Secondary | ICD-10-CM

## 2011-09-20 DIAGNOSIS — R42 Dizziness and giddiness: Secondary | ICD-10-CM

## 2011-09-20 DIAGNOSIS — Z23 Encounter for immunization: Secondary | ICD-10-CM

## 2011-09-20 DIAGNOSIS — Z136 Encounter for screening for cardiovascular disorders: Secondary | ICD-10-CM

## 2011-09-20 MED ORDER — MECLIZINE HCL 12.5 MG PO TABS: 12.5000 mg | ORAL_TABLET | Freq: Three times a day (TID) | ORAL | Status: DC | PRN

## 2011-09-20 MED ORDER — ZOLPIDEM TARTRATE 10 MG PO TABS: 5.0000 mg | ORAL_TABLET | Freq: Every evening | ORAL | Status: DC | PRN

## 2011-09-20 NOTE — Assessment & Plan Note (Signed)
We discussed age appropriate health related issues, including available/recomended screening tests and vaccinations. We discussed a need for adhering to healthy diet and exercise. Labs/EKG were reviewed/ordered. All questions were answered.   

## 2011-09-20 NOTE — Assessment & Plan Note (Signed)
2011 - chronic by now (2012) ENT consult

## 2011-09-20 NOTE — Progress Notes (Signed)
  Subjective:    Patient ID: Tracy May, female    DOB: Feb 08, 1956, 55 y.o.   MRN: [id number]  HPI  The patient is here for a wellness exam. The patient has been doing well overall without major physical or psychological issues going on lately. Review of Systems  Constitutional: Negative for chills, activity change, appetite change, fatigue and unexpected weight change.  HENT: Negative for congestion, mouth sores and sinus pressure.   Eyes: Negative for visual disturbance.  Respiratory: Negative for cough and chest tightness.   Gastrointestinal: Negative for nausea and abdominal pain.  Genitourinary: Negative for frequency, difficulty urinating and vaginal pain.  Musculoskeletal: Negative for back pain and gait problem.  Skin: Negative for pallor and rash.  Neurological: Negative for dizziness, tremors, weakness, numbness and headaches.  Psychiatric/Behavioral: Negative for confusion and sleep disturbance. The patient is not nervous/anxious.        Objective:   Physical Exam  Constitutional: She appears well-developed. No distress.  HENT:  Head: Normocephalic.  Right Ear: External ear normal.  Left Ear: External ear normal.  Nose: Nose normal.  Mouth/Throat: Oropharynx is clear and moist.  Eyes: Conjunctivae are normal. Pupils are equal, round, and reactive to light. Right eye exhibits no discharge. Left eye exhibits no discharge.  Neck: Normal range of motion. Neck supple. No JVD present. No tracheal deviation present. No thyromegaly present.  Cardiovascular: Normal rate, regular rhythm and normal heart sounds.   Pulmonary/Chest: No stridor. No respiratory distress. She has no wheezes.  Abdominal: Soft. Bowel sounds are normal. She exhibits no distension and no mass. There is no tenderness. There is no rebound and no guarding.  Musculoskeletal: She exhibits no edema and no tenderness.  Lymphadenopathy:    She has no cervical adenopathy.  Neurological: She displays normal  reflexes. No cranial nerve deficit. She exhibits normal muscle tone. Coordination normal.  Skin: No rash noted. No erythema.  Psychiatric: She has a normal mood and affect. Her behavior is normal. Judgment and thought content normal.    Lab Results  Component Value Date   WBC 4.9 09/15/2011   HGB 13.9 09/15/2011   HCT 40.6 09/15/2011   PLT 312.0 09/15/2011   GLUCOSE 86 09/15/2011   CHOL 239* 09/15/2011   TRIG 36.0 09/15/2011   HDL 106.60 09/15/2011   LDLDIRECT 104.1 09/15/2011   LDLCALC 91 12/13/2009   ALT 15 09/15/2011   AST 16 09/15/2011   NA 143 09/15/2011   K 4.4 09/15/2011   CL 107 09/15/2011   CREATININE 0.8 09/15/2011   BUN 14 09/15/2011   CO2 29 09/15/2011   TSH 0.66 09/15/2011         Assessment & Plan:

## 2011-09-28 ENCOUNTER — Telehealth: Payer: Self-pay | Admitting: *Deleted

## 2011-09-28 DIAGNOSIS — Z0000 Encounter for general adult medical examination without abnormal findings: Secondary | ICD-10-CM

## 2011-09-28 NOTE — Telephone Encounter (Signed)
Labs entered.

## 2011-09-28 NOTE — Telephone Encounter (Signed)
Message copied by Merrilyn Puma on Thu Sep 28, 2011  1:21 PM ------      Message from: COUSIN, SHARON T      Created: Thu Aug 31, 2011  9:22 AM      Regarding: [facility] DATE 09/19/12       THANKS

## 2011-10-06 ENCOUNTER — Ambulatory Visit: Payer: Managed Care, Other (non HMO)

## 2011-10-12 ENCOUNTER — Ambulatory Visit
Admission: RE | Admit: 2011-10-12 | Discharge: 2011-10-12 | Disposition: A | Discharge status: Home / Self Care | Payer: Managed Care, Other (non HMO) | Source: Ambulatory Visit | Attending: Obstetrics and Gynecology | Admitting: Obstetrics and Gynecology

## 2011-10-12 DIAGNOSIS — Z1231 Encounter for screening mammogram for malignant neoplasm of breast: Secondary | ICD-10-CM

## 2012-03-22 ENCOUNTER — Telehealth: Payer: Self-pay | Admitting: *Deleted

## 2012-03-22 NOTE — Telephone Encounter (Signed)
Rf req for Zolpidem 10 mg 1/2-1 po qhs. Ok to Rf? 

## 2012-03-24 NOTE — Telephone Encounter (Signed)
OK to fill this prescription with additional refills x3 Thank you!  

## 2012-03-25 MED ORDER — ZOLPIDEM TARTRATE 10 MG PO TABS: 5.0000 mg | ORAL_TABLET | Freq: Every evening | ORAL | Status: DC | PRN

## 2012-03-25 NOTE — Telephone Encounter (Signed)
Done

## 2012-07-16 ENCOUNTER — Other Ambulatory Visit: Payer: Self-pay | Admitting: Internal Medicine

## 2012-07-16 NOTE — Telephone Encounter (Signed)
Ok to Rf? 

## 2012-10-14 ENCOUNTER — Encounter: Payer: Managed Care, Other (non HMO) | Admitting: Internal Medicine

## 2012-10-16 ENCOUNTER — Other Ambulatory Visit (INDEPENDENT_AMBULATORY_CARE_PROVIDER_SITE_OTHER): Payer: Managed Care, Other (non HMO)

## 2012-10-16 DIAGNOSIS — Z0000 Encounter for general adult medical examination without abnormal findings: Secondary | ICD-10-CM

## 2012-10-16 LAB — BASIC METABOLIC PANEL
BUN: 9 mg/dL (ref 6–23)
Creatinine, Ser: 0.8 mg/dL (ref 0.4–1.2)
GFR: 100.1 mL/min (ref >60.00)
Potassium: 4.6 mEq/L (ref 3.5–5.1)

## 2012-10-16 LAB — TSH: TSH: 0.47 u[IU]/mL (ref 0.35–5.50)

## 2012-10-16 LAB — LDL CHOLESTEROL, DIRECT: Direct LDL: 102.3 mg/dL

## 2012-10-16 LAB — HEPATIC FUNCTION PANEL
Bilirubin, Direct: 0 mg/dL (ref 0.0–0.3)
Total Bilirubin: 0.7 mg/dL (ref 0.3–1.2)

## 2012-10-16 LAB — CBC WITH DIFFERENTIAL/PLATELET
Basophils Absolute: 0 10*3/uL (ref 0.0–0.1)
Eosinophils Relative: 2.9 % (ref 0.0–5.0)
HCT: 41.3 % (ref 36.0–46.0)
Hemoglobin: 14 g/dL (ref 12.0–15.0)
Lymphocytes Relative: 35.5 % (ref 12.0–46.0)
Lymphs Abs: 1.7 10*3/uL (ref 0.7–4.0)
Monocytes Relative: 7 % (ref 3.0–12.0)
Neutro Abs: 2.6 10*3/uL (ref 1.4–7.7)
RDW: 12.5 % (ref 11.5–14.6)
WBC: 4.9 10*3/uL (ref 4.5–10.5)

## 2012-10-16 LAB — LIPID PANEL
Cholesterol: 238 mg/dL — ABNORMAL HIGH (ref 0–200)
HDL: 106.2 mg/dL (ref >39.00)
Total CHOL/HDL Ratio: 2
VLDL: 10.4 mg/dL (ref 0.0–40.0)

## 2012-10-19 ENCOUNTER — Other Ambulatory Visit: Payer: Self-pay | Admitting: Internal Medicine

## 2012-10-22 ENCOUNTER — Ambulatory Visit (INDEPENDENT_AMBULATORY_CARE_PROVIDER_SITE_OTHER): Payer: BC Managed Care – PPO | Admitting: Internal Medicine

## 2012-10-22 ENCOUNTER — Encounter: Payer: Self-pay | Admitting: Internal Medicine

## 2012-10-22 VITALS — BP 110/80 | HR 80 | Temp 98.4°F | Resp 16 | Ht 64.0 in | Wt 152.0 lb

## 2012-10-22 DIAGNOSIS — Z0000 Encounter for general adult medical examination without abnormal findings: Secondary | ICD-10-CM

## 2012-10-22 MED ORDER — ZOLPIDEM TARTRATE 10 MG PO TABS: 10.0000 mg | ORAL_TABLET | Freq: Every evening | ORAL | Status: DC | PRN

## 2012-10-22 NOTE — Progress Notes (Signed)
   Subjective:    HPI  The patient is here for a wellness exam. The patient has been doing well overall without major physical or psychological issues going on lately.  Review of Systems  Constitutional: Negative for chills, activity change, appetite change, fatigue and unexpected weight change.  HENT: Negative for congestion, mouth sores and sinus pressure.   Eyes: Negative for visual disturbance.  Respiratory: Negative for cough and chest tightness.   Gastrointestinal: Negative for nausea and abdominal pain.  Genitourinary: Negative for frequency, difficulty urinating and vaginal pain.  Musculoskeletal: Negative for back pain and gait problem.  Skin: Negative for pallor and rash.  Neurological: Negative for dizziness, tremors, weakness, numbness and headaches.  Psychiatric/Behavioral: Negative for confusion and sleep disturbance. The patient is not nervous/anxious.        Objective:   Physical Exam  Constitutional: She appears well-developed. No distress.  HENT:  Head: Normocephalic.  Right Ear: External ear normal.  Left Ear: External ear normal.  Nose: Nose normal.  Mouth/Throat: Oropharynx is clear and moist.  Eyes: Conjunctivae normal are normal. Pupils are equal, round, and reactive to light. Right eye exhibits no discharge. Left eye exhibits no discharge.  Neck: Normal range of motion. Neck supple. No JVD present. No tracheal deviation present. No thyromegaly present.  Cardiovascular: Normal rate, regular rhythm and normal heart sounds.   Pulmonary/Chest: No stridor. No respiratory distress. She has no wheezes.  Abdominal: Soft. Bowel sounds are normal. She exhibits no distension and no mass. There is no tenderness. There is no rebound and no guarding.  Musculoskeletal: She exhibits no edema and no tenderness.  Lymphadenopathy:    She has no cervical adenopathy.  Neurological: She displays normal reflexes. No cranial nerve deficit. She exhibits normal muscle tone.  Coordination normal.  Skin: No rash noted. No erythema.  Psychiatric: She has a normal mood and affect. Her behavior is normal. Judgment and thought content normal.    Lab Results  Component Value Date   WBC 4.9 10/16/2012   HGB 14.0 10/16/2012   HCT 41.3 10/16/2012   PLT 334.0 10/16/2012   GLUCOSE 96 10/16/2012   CHOL 238* 10/16/2012   TRIG 52.0 10/16/2012   HDL 106.20 10/16/2012   LDLDIRECT 102.3 10/16/2012   LDLCALC 91 12/13/2009   ALT 16 10/16/2012   AST 17 10/16/2012   NA 142 10/16/2012   K 4.6 10/16/2012   CL 107 10/16/2012   CREATININE 0.8 10/16/2012   BUN 9 10/16/2012   CO2 29 10/16/2012   TSH 0.47 10/16/2012         Assessment & Plan:

## 2012-10-22 NOTE — Assessment & Plan Note (Signed)
We discussed age appropriate health related issues, including available/recomended screening tests and vaccinations. We discussed a need for adhering to healthy diet and exercise. Labs/EKG were reviewed/ordered. All questions were answered.   

## 2013-01-14 ENCOUNTER — Other Ambulatory Visit: Payer: Self-pay | Admitting: Internal Medicine

## 2013-02-11 DIAGNOSIS — Z0279 Encounter for issue of other medical certificate: Secondary | ICD-10-CM

## 2013-05-15 ENCOUNTER — Other Ambulatory Visit: Payer: Self-pay | Admitting: Internal Medicine

## 2013-07-31 ENCOUNTER — Other Ambulatory Visit: Payer: Self-pay

## 2013-09-08 ENCOUNTER — Ambulatory Visit: Payer: BC Managed Care – PPO | Admitting: Internal Medicine

## 2013-09-11 ENCOUNTER — Encounter: Payer: Self-pay | Admitting: Internal Medicine

## 2013-09-11 ENCOUNTER — Ambulatory Visit (INDEPENDENT_AMBULATORY_CARE_PROVIDER_SITE_OTHER): Payer: BC Managed Care – PPO | Admitting: Internal Medicine

## 2013-09-11 VITALS — BP 144/96 | HR 80 | Temp 98.9°F | Resp 16 | Wt 149.0 lb

## 2013-09-11 DIAGNOSIS — J019 Acute sinusitis, unspecified: Secondary | ICD-10-CM

## 2013-09-11 MED ORDER — CEFUROXIME AXETIL 500 MG PO TABS: ORAL_TABLET | ORAL | Status: DC

## 2013-09-11 NOTE — Progress Notes (Signed)
   Subjective:    Sinusitis This is a new problem. The current episode started 1 to 4 weeks ago. The problem has been rapidly improving since onset. There has been no fever. The pain is moderate (upper teeth hurt). Pertinent negatives include no chills, congestion, coughing, headaches or sinus pressure. Past treatments include oral decongestants. The treatment provided mild relief.      Review of Systems  Constitutional: Negative for chills, activity change, appetite change, fatigue and unexpected weight change.  HENT: Negative for congestion, mouth sores and sinus pressure.   Eyes: Negative for visual disturbance.  Respiratory: Negative for cough and chest tightness.   Gastrointestinal: Negative for nausea and abdominal pain.  Genitourinary: Negative for frequency, difficulty urinating and vaginal pain.  Musculoskeletal: Negative for back pain and gait problem.  Skin: Negative for pallor and rash.  Neurological: Negative for dizziness, tremors, weakness, numbness and headaches.  Psychiatric/Behavioral: Negative for confusion and sleep disturbance. The patient is not nervous/anxious.        Objective:   Physical Exam  Constitutional: She appears well-developed. No distress.  HENT:  Head: Normocephalic.  Right Ear: External ear normal.  Left Ear: External ear normal.  Nose: Nose normal.  Mouth/Throat: Oropharynx is clear and moist.  Eyes: Conjunctivae are normal. Pupils are equal, round, and reactive to light. Right eye exhibits no discharge. Left eye exhibits no discharge.  Neck: Normal range of motion. Neck supple. No JVD present. No tracheal deviation present. No thyromegaly present.  Cardiovascular: Normal rate, regular rhythm and normal heart sounds.   Pulmonary/Chest: No stridor. No respiratory distress. She has no wheezes.  Abdominal: Soft. Bowel sounds are normal. She exhibits no distension and no mass. There is no tenderness. There is no rebound and no guarding.   Musculoskeletal: She exhibits no edema and no tenderness.  Lymphadenopathy:    She has no cervical adenopathy.  Neurological: She displays normal reflexes. No cranial nerve deficit. She exhibits normal muscle tone. Coordination normal.  Skin: No rash noted. No erythema.  Psychiatric: She has a normal mood and affect. Her behavior is normal. Judgment and thought content normal.  L ear wax Nares w/erythema  Lab Results  Component Value Date   WBC 4.9 10/16/2012   HGB 14.0 10/16/2012   HCT 41.3 10/16/2012   PLT 334.0 10/16/2012   GLUCOSE 96 10/16/2012   CHOL 238* 10/16/2012   TRIG 52.0 10/16/2012   HDL 106.20 10/16/2012   LDLDIRECT 102.3 10/16/2012   LDLCALC 91 12/13/2009   ALT 16 10/16/2012   AST 17 10/16/2012   NA 142 10/16/2012   K 4.6 10/16/2012   CL 107 10/16/2012   CREATININE 0.8 10/16/2012   BUN 9 10/16/2012   CO2 29 10/16/2012   TSH 0.47 10/16/2012         Assessment & Plan:

## 2013-09-11 NOTE — Assessment & Plan Note (Signed)
Start Ceftin 

## 2013-09-11 NOTE — Patient Instructions (Signed)
Left ear wax   Use over-the-counter  "cold" medicines  such as"Afrin" nasal spray for nasal congestion as directed instead. Use" Delsym" or" Robitussin" cough syrup varietis for cough.  You can use plain "Tylenol" or "Advil" for fever, chills and achyness.   Please, make an appointment if you are not better or if you're worse.

## 2013-09-11 NOTE — Progress Notes (Signed)
Pre visit review using our clinic review tool, if applicable. No additional management support is needed unless otherwise documented below in the visit note. 

## 2013-09-26 ENCOUNTER — Telehealth: Payer: Self-pay | Admitting: *Deleted

## 2013-09-26 DIAGNOSIS — R062 Wheezing: Secondary | ICD-10-CM

## 2013-09-26 NOTE — Telephone Encounter (Signed)
Pt left vm stating she is still wheezing. She states MD mentioned CXR if needed. She wants to know if she can have CXR w/o OV. I called pt and left a detailed mess ingorming her MD is out of office until 09/29/13 and to go to [facility] or our Sat clinic if she needs this before his return. Please advise on CXR.

## 2013-09-28 NOTE — Telephone Encounter (Signed)
OK CXR °Thx °

## 2013-09-29 NOTE — Telephone Encounter (Signed)
Left detailed mess informing pt of below.  

## 2013-09-30 ENCOUNTER — Ambulatory Visit (INDEPENDENT_AMBULATORY_CARE_PROVIDER_SITE_OTHER)
Admission: RE | Admit: 2013-09-30 | Discharge: 2013-09-30 | Disposition: A | Discharge status: Home / Self Care | Payer: BC Managed Care – PPO | Source: Ambulatory Visit | Attending: Internal Medicine | Admitting: Internal Medicine

## 2013-09-30 DIAGNOSIS — R062 Wheezing: Secondary | ICD-10-CM

## 2013-10-01 ENCOUNTER — Telehealth: Payer: Self-pay | Admitting: *Deleted

## 2013-10-01 NOTE — Telephone Encounter (Signed)
Patient phoned requesting results of diagnostics performed 09/30/13.  Please advise.

## 2013-10-01 NOTE — Telephone Encounter (Signed)
I emailed this to the pt on 1/6:  Entered by Tresa GarterAleksei V Plotnikov, MD at 09/30/2013 5:41 PM Read by Othella Boyerebecca L Risdon at 09/30/2013 8:33 PM Dear Mrs Tracy May It could be all related to a virus. Please let me know if you are not getting better.   Sincerely,  Sonda PrimesAlex Plotnikov, MD

## 2013-10-02 NOTE — Telephone Encounter (Signed)
Phoned pt to relay MD message.  LM

## 2013-10-24 ENCOUNTER — Other Ambulatory Visit: Payer: Self-pay | Admitting: Internal Medicine

## 2013-10-29 LAB — HM MAMMOGRAPHY

## 2013-11-03 ENCOUNTER — Ambulatory Visit (INDEPENDENT_AMBULATORY_CARE_PROVIDER_SITE_OTHER): Payer: BC Managed Care – PPO | Admitting: Internal Medicine

## 2013-11-03 ENCOUNTER — Encounter: Payer: Self-pay | Admitting: Internal Medicine

## 2013-11-03 VITALS — BP 130/80 | HR 80 | Temp 97.9°F | Resp 16 | Wt 146.0 lb

## 2013-11-03 DIAGNOSIS — R030 Elevated blood-pressure reading, without diagnosis of hypertension: Secondary | ICD-10-CM

## 2013-11-03 DIAGNOSIS — H6120 Impacted cerumen, unspecified ear: Secondary | ICD-10-CM

## 2013-11-03 DIAGNOSIS — H6122 Impacted cerumen, left ear: Secondary | ICD-10-CM

## 2013-11-03 DIAGNOSIS — IMO0001 Reserved for inherently not codable concepts without codable children: Secondary | ICD-10-CM | POA: Insufficient documentation

## 2013-11-03 DIAGNOSIS — G4700 Insomnia, unspecified: Secondary | ICD-10-CM

## 2013-11-03 MED ORDER — ZOLPIDEM TARTRATE 10 MG PO TABS
10.0000 mg | ORAL_TABLET | Freq: Every evening | ORAL | Status: DC | PRN
Start: 2013-11-03 — End: 2014-05-09

## 2013-11-03 MED ORDER — HYDROCHLOROTHIAZIDE 12.5 MG PO CAPS: 12.5000 mg | ORAL_CAPSULE | Freq: Every day | ORAL | Status: DC

## 2013-11-03 NOTE — Progress Notes (Signed)
Pre visit review using our clinic review tool, if applicable. No additional management support is needed unless otherwise documented below in the visit note. 

## 2013-11-03 NOTE — Patient Instructions (Signed)
No added salt diet

## 2013-11-03 NOTE — Assessment & Plan Note (Signed)
NAS diet Microzide qd

## 2013-11-03 NOTE — Assessment & Plan Note (Signed)
Will irrigate 

## 2013-11-03 NOTE — Progress Notes (Signed)
   Subjective:    HPI  C/o elevated BP - SBP 150's  BP Readings from Last 3 Encounters:  11/03/13 130/80  09/11/13 144/96  10/22/12 110/80     Review of Systems  Constitutional: Negative for chills, activity change, appetite change, fatigue and unexpected weight change.  HENT: Negative for congestion, mouth sores and sinus pressure.   Eyes: Negative for visual disturbance.  Respiratory: Negative for cough and chest tightness.   Gastrointestinal: Negative for nausea and abdominal pain.  Genitourinary: Negative for frequency, difficulty urinating and vaginal pain.  Musculoskeletal: Negative for back pain and gait problem.  Skin: Negative for pallor and rash.  Neurological: Negative for dizziness, tremors, weakness, numbness and headaches.  Psychiatric/Behavioral: Negative for confusion and sleep disturbance. The patient is not nervous/anxious.        Objective:   Physical Exam  Constitutional: She appears well-developed. No distress.  HENT:  Head: Normocephalic.  Right Ear: External ear normal.  Left Ear: External ear normal.  Nose: Nose normal.  Mouth/Throat: Oropharynx is clear and moist.  Eyes: Conjunctivae are normal. Pupils are equal, round, and reactive to light. Right eye exhibits no discharge. Left eye exhibits no discharge.  Neck: Normal range of motion. Neck supple. No JVD present. No tracheal deviation present. No thyromegaly present.  Cardiovascular: Normal rate, regular rhythm and normal heart sounds.   Pulmonary/Chest: No stridor. No respiratory distress. She has no wheezes.  Abdominal: Soft. Bowel sounds are normal. She exhibits no distension and no mass. There is no tenderness. There is no rebound and no guarding.  Musculoskeletal: She exhibits no edema and no tenderness.  Lymphadenopathy:    She has no cervical adenopathy.  Neurological: She displays normal reflexes. No cranial nerve deficit. She exhibits normal muscle tone. Coordination normal.  Skin: No  rash noted. No erythema.  Psychiatric: She has a normal mood and affect. Her behavior is normal. Judgment and thought content normal.  L ear wax  Lab Results  Component Value Date   WBC 4.9 10/16/2012   HGB 14.0 10/16/2012   HCT 41.3 10/16/2012   PLT 334.0 10/16/2012   GLUCOSE 96 10/16/2012   CHOL 238* 10/16/2012   TRIG 52.0 10/16/2012   HDL 106.20 10/16/2012   LDLDIRECT 102.3 10/16/2012   LDLCALC 91 12/13/2009   ALT 16 10/16/2012   AST 17 10/16/2012   NA 142 10/16/2012   K 4.6 10/16/2012   CL 107 10/16/2012   CREATININE 0.8 10/16/2012   BUN 9 10/16/2012   CO2 29 10/16/2012   TSH 0.47 10/16/2012    Procedure Note :     Procedure :  Ear irrigation   Indication:  Cerumen impaction L   Risks, including pain, dizziness, eardrum perforation, bleeding, infection and others as well as benefits were explained to the patient in detail. Verbal consent was obtained and the patient agreed to proceed.    We used "The Elephant Ear Irrigation Device" filled with lukewarm water for irrigation. A large amount wax was recovered. Procedure has also required manual wax removal with an ear loop.   Tolerated well. Complications: None.   Postprocedure instructions :  Call if problems.        Assessment & Plan:

## 2013-11-03 NOTE — Assessment & Plan Note (Signed)
Continue with current prescription therapy as reflected on the Med list. prn 

## 2014-02-23 ENCOUNTER — Ambulatory Visit: Payer: BC Managed Care – PPO | Admitting: Internal Medicine

## 2014-05-09 ENCOUNTER — Other Ambulatory Visit: Payer: Self-pay | Admitting: Internal Medicine

## 2014-05-12 ENCOUNTER — Other Ambulatory Visit: Payer: Self-pay | Admitting: Internal Medicine

## 2014-05-14 NOTE — Telephone Encounter (Signed)
Not sure where printed script is went ahead [facility]. Spoke with Kent/pharmacist gave md approval.../lmb

## 2014-05-15 NOTE — Telephone Encounter (Signed)
Notified pharmacy spoke with Susy FrizzleMatt rx was called into pharmacy yesterday...Raechel Chute/lmb

## 2014-07-10 ENCOUNTER — Other Ambulatory Visit: Payer: Self-pay

## 2014-08-08 ENCOUNTER — Other Ambulatory Visit: Payer: Self-pay | Admitting: Internal Medicine

## 2014-08-12 ENCOUNTER — Telehealth: Payer: Self-pay | Admitting: Internal Medicine

## 2014-08-12 NOTE — Telephone Encounter (Signed)
States pharmacy sent over refill request on Ambien on Monday.  Patient only has two more pills left.  [facility] [place] on [address].

## 2014-08-13 ENCOUNTER — Other Ambulatory Visit: Payer: Self-pay | Admitting: Internal Medicine

## 2014-08-13 MED ORDER — ZOLPIDEM TARTRATE 10 MG PO TABS: 10.0000 mg | ORAL_TABLET | Freq: Every evening | ORAL | Status: DC | PRN

## 2014-08-13 NOTE — Telephone Encounter (Signed)
OK to fill this prescription with additional refills x3 Thank you!  

## 2014-08-13 NOTE — Telephone Encounter (Signed)
Done

## 2014-08-26 ENCOUNTER — Other Ambulatory Visit (INDEPENDENT_AMBULATORY_CARE_PROVIDER_SITE_OTHER): Payer: BC Managed Care – PPO

## 2014-08-26 ENCOUNTER — Ambulatory Visit (INDEPENDENT_AMBULATORY_CARE_PROVIDER_SITE_OTHER): Payer: BC Managed Care – PPO | Admitting: Internal Medicine

## 2014-08-26 ENCOUNTER — Encounter: Payer: Self-pay | Admitting: Internal Medicine

## 2014-08-26 VITALS — BP 140/80 | HR 75 | Temp 98.2°F | Ht 64.0 in | Wt 157.0 lb

## 2014-08-26 DIAGNOSIS — Z0000 Encounter for general adult medical examination without abnormal findings: Secondary | ICD-10-CM

## 2014-08-26 DIAGNOSIS — G4700 Insomnia, unspecified: Secondary | ICD-10-CM

## 2014-08-26 DIAGNOSIS — H8110 Benign paroxysmal vertigo, unspecified ear: Secondary | ICD-10-CM | POA: Insufficient documentation

## 2014-08-26 LAB — CBC WITH DIFFERENTIAL/PLATELET
BASOS ABS: 0 10*3/uL (ref 0.0–0.1)
Basophils Relative: 0.5 % (ref 0.0–3.0)
EOS ABS: 0.1 10*3/uL (ref 0.0–0.7)
Eosinophils Relative: 2.9 % (ref 0.0–5.0)
HCT: 42 % (ref 36.0–46.0)
Hemoglobin: 13.8 g/dL (ref 12.0–15.0)
LYMPHS PCT: 27.5 % (ref 12.0–46.0)
Lymphs Abs: 1.3 10*3/uL (ref 0.7–4.0)
MCHC: 32.9 g/dL (ref 30.0–36.0)
MCV: 96.2 fl (ref 78.0–100.0)
Monocytes Absolute: 0.4 10*3/uL (ref 0.1–1.0)
Monocytes Relative: 7.4 % (ref 3.0–12.0)
NEUTROS PCT: 61.7 % (ref 43.0–77.0)
Neutro Abs: 3 10*3/uL (ref 1.4–7.7)
Platelets: 336 10*3/uL (ref 150.0–400.0)
RBC: 4.37 Mil/uL (ref 3.87–5.11)
RDW: 12.7 % (ref 11.5–15.5)
WBC: 4.8 10*3/uL (ref 4.0–10.5)

## 2014-08-26 LAB — URINALYSIS
Bilirubin Urine: NEGATIVE
HGB URINE DIPSTICK: NEGATIVE
Ketones, ur: NEGATIVE
Leukocytes, UA: NEGATIVE
Nitrite: NEGATIVE
SPECIFIC GRAVITY, URINE: 1.015 (ref 1.000–1.030)
Total Protein, Urine: NEGATIVE
URINE GLUCOSE: NEGATIVE
UROBILINOGEN UA: 0.2 (ref 0.0–1.0)
pH: 8.5 — AB (ref 5.0–8.0)

## 2014-08-26 LAB — LIPID PANEL
CHOL/HDL RATIO: 3
Cholesterol: 255 mg/dL — ABNORMAL HIGH (ref 0–200)
HDL: 101.1 mg/dL (ref >39.00)
LDL Cholesterol: 143 mg/dL — ABNORMAL HIGH (ref 0–99)
NONHDL: 153.9
Triglycerides: 54 mg/dL (ref 0.0–149.0)
VLDL: 10.8 mg/dL (ref 0.0–40.0)

## 2014-08-26 LAB — TSH: TSH: 0.7 u[IU]/mL (ref 0.35–4.50)

## 2014-08-26 LAB — HEPATIC FUNCTION PANEL
ALK PHOS: 52 U/L (ref 39–117)
ALT: 19 U/L (ref 0–35)
AST: 22 U/L (ref 0–37)
Albumin: 4.1 g/dL (ref 3.5–5.2)
BILIRUBIN DIRECT: 0 mg/dL (ref 0.0–0.3)
Total Bilirubin: 0.5 mg/dL (ref 0.2–1.2)
Total Protein: 7 g/dL (ref 6.0–8.3)

## 2014-08-26 LAB — BASIC METABOLIC PANEL
BUN: 9 mg/dL (ref 6–23)
CALCIUM: 9.7 mg/dL (ref 8.4–10.5)
CO2: 26 meq/L (ref 19–32)
CREATININE: 0.8 mg/dL (ref 0.4–1.2)
Chloride: 104 mEq/L (ref 96–112)
GFR: 100.94 mL/min (ref >60.00)
GLUCOSE: 83 mg/dL (ref 70–99)
Potassium: 4.4 mEq/L (ref 3.5–5.1)
Sodium: 139 mEq/L (ref 135–145)

## 2014-08-26 MED ORDER — MECLIZINE HCL 12.5 MG PO TABS: ORAL_TABLET | ORAL | Status: DC

## 2014-08-26 NOTE — Assessment & Plan Note (Signed)
Continue with current prescription therapy as reflected on the Med list.  Potential benefits of a long term benzodiazepines  use as well as potential risks  and complications were explained to the patient and were aknowledged.  

## 2014-08-26 NOTE — Progress Notes (Signed)
Pre visit review using our clinic review tool, if applicable. No additional management support is needed unless otherwise documented below in the visit note. 

## 2014-08-26 NOTE — Assessment & Plan Note (Addendum)
We discussed age appropriate health related issues, including available/recomended screening tests and vaccinations. We discussed a need for adhering to healthy diet and exercise. Labs/EKG were reviewed/ordered. All questions were answered.  Colon due at 58 yo

## 2014-08-26 NOTE — Assessment & Plan Note (Addendum)
Meclizine prn B-D exercises

## 2014-08-26 NOTE — Progress Notes (Signed)
   Subjective:    HPI  The patient is here for a wellness exam. The patient has been doing well overall without major physical or psychological issues going on lately. C/o recurrent dizziness  Review of Systems  Constitutional: Negative for chills, activity change, appetite change, fatigue and unexpected weight change.  HENT: Negative for congestion, mouth sores and sinus pressure.   Eyes: Negative for visual disturbance.  Respiratory: Negative for cough and chest tightness.   Gastrointestinal: Negative for nausea and abdominal pain.  Genitourinary: Negative for frequency, difficulty urinating and vaginal pain.  Musculoskeletal: Negative for back pain and gait problem.  Skin: Negative for pallor and rash.  Neurological: Negative for dizziness, tremors, weakness, numbness and headaches.  Psychiatric/Behavioral: Negative for confusion and sleep disturbance. The patient is not nervous/anxious.    Wt Readings from Last 3 Encounters:  08/26/14 157 lb (71.215 kg)  11/03/13 146 lb (66.225 kg)  09/11/13 149 lb (67.586 kg)   BP Readings from Last 3 Encounters:  08/26/14 140/80  11/03/13 130/80  09/11/13 144/96        Objective:   Physical Exam  Constitutional: She appears well-developed. No distress.  HENT:  Head: Normocephalic.  Right Ear: External ear normal.  Left Ear: External ear normal.  Nose: Nose normal.  Mouth/Throat: Oropharynx is clear and moist.  Eyes: Conjunctivae are normal. Pupils are equal, round, and reactive to light. Right eye exhibits no discharge. Left eye exhibits no discharge.  Neck: Normal range of motion. Neck supple. No JVD present. No tracheal deviation present. No thyromegaly present.  Cardiovascular: Normal rate, regular rhythm and normal heart sounds.   Pulmonary/Chest: No stridor. No respiratory distress. She has no wheezes.  Abdominal: Soft. Bowel sounds are normal. She exhibits no distension and no mass. There is no tenderness. There is no rebound  and no guarding.  Musculoskeletal: She exhibits no edema or tenderness.  Lymphadenopathy:    She has no cervical adenopathy.  Neurological: She displays normal reflexes. No cranial nerve deficit. She exhibits normal muscle tone. Coordination normal.  Skin: No rash noted. No erythema.  Psychiatric: She has a normal mood and affect. Her behavior is normal. Judgment and thought content normal.    Lab Results  Component Value Date   WBC 4.9 10/16/2012   HGB 14.0 10/16/2012   HCT 41.3 10/16/2012   PLT 334.0 10/16/2012   GLUCOSE 96 10/16/2012   CHOL 238* 10/16/2012   TRIG 52.0 10/16/2012   HDL 106.20 10/16/2012   LDLDIRECT 102.3 10/16/2012   LDLCALC 91 12/13/2009   ALT 16 10/16/2012   AST 17 10/16/2012   NA 142 10/16/2012   K 4.6 10/16/2012   CL 107 10/16/2012   CREATININE 0.8 10/16/2012   BUN 9 10/16/2012   CO2 29 10/16/2012   TSH 0.47 10/16/2012         Assessment & Plan:

## 2014-09-01 ENCOUNTER — Encounter: Payer: Self-pay | Admitting: Internal Medicine

## 2015-05-11 ENCOUNTER — Telehealth: Payer: Self-pay | Admitting: Internal Medicine

## 2015-05-11 NOTE — Telephone Encounter (Signed)
Patient requesting refills for meclizine (ANTIVERT) 12.5 MG tablet [[id number]] and zolpidem (AMBIEN) 10 MG tablet [[id number]]  Pharmacy is [facility] [address] in [place]

## 2015-05-12 MED ORDER — ZOLPIDEM TARTRATE 10 MG PO TABS: 10.0000 mg | ORAL_TABLET | Freq: Every evening | ORAL | Status: DC | PRN

## 2015-05-12 MED ORDER — MECLIZINE HCL 12.5 MG PO TABS: ORAL_TABLET | ORAL | Status: DC

## 2015-05-12 NOTE — Telephone Encounter (Signed)
Ok - see Rx Thx 

## 2015-05-13 MED ORDER — MECLIZINE HCL 12.5 MG PO TABS: ORAL_TABLET | ORAL | Status: DC

## 2015-05-13 NOTE — Telephone Encounter (Signed)
Pt call and spoke with Shana rx's [facility]. Resent meclizine & call zolpidem into pharmacy spoke with MaryGrace/pharmacist.../lmb

## 2015-08-31 ENCOUNTER — Ambulatory Visit (INDEPENDENT_AMBULATORY_CARE_PROVIDER_SITE_OTHER): Payer: 59 | Admitting: Adult Health

## 2015-08-31 ENCOUNTER — Encounter: Payer: Self-pay | Admitting: Adult Health

## 2015-08-31 VITALS — BP 160/108 | Temp 98.3°F | Ht 64.0 in | Wt 167.1 lb

## 2015-08-31 DIAGNOSIS — I10 Essential (primary) hypertension: Secondary | ICD-10-CM

## 2015-08-31 MED ORDER — HYDROCHLOROTHIAZIDE 12.5 MG PO CAPS: 12.5000 mg | ORAL_CAPSULE | Freq: Every day | ORAL | Status: DC

## 2015-08-31 NOTE — Progress Notes (Signed)
Pre visit review using our clinic review tool, if applicable. No additional management support is needed unless otherwise documented below in the visit note. 

## 2015-08-31 NOTE — Progress Notes (Signed)
Subjective:    Patient ID: Tracy May, female    DOB: [birth year], 59 y.o.   MRN: [id number]  HPI  59 year old female who presents to the office for hypertension   Yesterday morning she was "dizzy" at work and thought it was BPPV. She went home and rested. When she woke up this morning she felt fine, until she started moving around and felt " a little lightheaded". She checked her BP and it was 166/108. Currently she feels " a little lightheaded."   She has not started any new medications. Denies any headaches or blurred vision.   Review of Systems  Constitutional: Negative.   Respiratory: Positive for wheezing.   Cardiovascular: Negative.   Neurological: Positive for dizziness, light-headedness and headaches. Negative for weakness.  All other systems reviewed and are negative.  Past Medical History  Diagnosis Date  . GERD (gastroesophageal reflux disease)   . Acne   . Vertigo   . Allergic rhinitis     Social History   Social History  . Marital Status: Married    Spouse Name: N/A  . Number of Children: N/A  . Years of Education: N/A   Occupational History  . Not on file.   Social History Main Topics  . Smoking status: Former Research scientist (life sciences)  . Smokeless tobacco: Not on file  . Alcohol Use: No  . Drug Use: No  . Sexual Activity: Not on file   Other Topics Concern  . Not on file   Social History Narrative   Regular Exercise- yes    Past Surgical History  Procedure Laterality Date  . Abdominal hysterectomy      Family History  Problem Relation Age of Onset  . Cancer Mother   . Diabetes Father   . Hypertension Other   . Pancreatic cancer Other   . Cancer Sister     glioblastoma    Allergies  Allergen Reactions  . Azithromycin     REACTION: nausea and stomach pains  . Sulfonamide Derivatives     Current Outpatient Prescriptions on File Prior to Visit  Medication Sig Dispense Refill  . acetaminophen (APAP EXTRA STRENGTH) 500 MG tablet Take 500 mg by  mouth every 6 (six) hours as needed.    . Azelaic Acid (FINACEA) 15 % cream Apply topically 2 (two) times daily. After skin is thoroughly washed and patted dry, gently but thoroughly massage a thin film of azelaic acid cream into the affected area twice daily, in the morning and evening.     . cetirizine (ZYRTEC) 10 MG tablet Take 10 mg by mouth daily.      . Cholecalciferol 1000 UNITS capsule Take 1,000 Units by mouth daily.      . diphenhydrAMINE (BENADRYL) 25 MG tablet Take 25 mg by mouth daily as needed.      Marland Kitchen estradiol (VIVELLE-DOT) 0.0375 MG/24HR Place 1 patch onto the skin 2 (two) times a week.    Marland Kitchen FIBER PO Take by mouth as needed.      . meclizine (ANTIVERT) 12.5 MG tablet TAKE 1-2 TABLETS BY MOUTH 3 TIMES DAILY AS NEEDED 100 tablet 1  . Multiple Vitamin (DAILY MULTIVITAMIN PO) Take 1 tablet by mouth daily.    Marland Kitchen zolpidem (AMBIEN) 10 MG tablet Take 1 tablet (10 mg total) by mouth at bedtime as needed. for sleep 90 tablet 1  . loratadine (CLARITIN) 10 MG tablet Take 10 mg by mouth daily.       No current facility-administered medications  on file prior to visit.    BP 160/108 mmHg  Temp(Src) 98.3 F (36.8 C) (Oral)  Ht $R'5\' 4"'yz$  (1.626 m)  Wt 167 lb 1.6 oz (75.796 kg)  BMI 28.67 kg/m2       Objective:   Physical Exam  Constitutional: She is oriented to person, place, and time. She appears well-developed and well-nourished. No distress.  Cardiovascular: Normal rate, regular rhythm, normal heart sounds and intact distal pulses.  Exam reveals no gallop and no friction rub.   No murmur heard. Pulmonary/Chest: Effort normal and breath sounds normal. No respiratory distress. She has no wheezes. She has no rales. She exhibits no tenderness.  Neurological: She is alert and oriented to person, place, and time.  Skin: Skin is warm and dry. No rash noted. She is not diaphoretic. No erythema. No pallor.  Psychiatric: She has a normal mood and affect. Her behavior is normal. Judgment and  thought content normal.  Nursing note and vitals reviewed.      Assessment & Plan:  1. Essential hypertension - EKG 12-Lead Sinus  Rhythm , rate 64 - BMP with eGFR - CBC with Differential/Platelet - hydrochlorothiazide (MICROZIDE) 12.5 MG capsule; Take 1 capsule (12.5 mg total) by mouth daily.  Dispense: 90 capsule; Refill: 3 - Follow up with PCP

## 2015-08-31 NOTE — Patient Instructions (Addendum)
It was a pleasure meeting you today!  I will follow up with you regarding your labs.   I have sent in a prescription for HCTZ 12.5 mg. You can start this tonight.   Monitor your blood pressure in the morning and at night, write your readings in a log,  and follow up with your PCP.

## 2015-09-01 LAB — BASIC METABOLIC PANEL WITH GFR
BUN: 16 mg/dL (ref 7–25)
CALCIUM: 9.6 mg/dL (ref 8.6–10.4)
CO2: 28 mmol/L (ref 20–31)
CREATININE: 0.76 mg/dL (ref 0.50–1.05)
Chloride: 99 mmol/L (ref 98–110)
GFR, Est Non African American: 87 mL/min (ref >=60)
Glucose, Bld: 92 mg/dL (ref 65–99)
Potassium: 4.6 mmol/L (ref 3.5–5.3)
Sodium: 137 mmol/L (ref 135–146)

## 2015-09-01 LAB — CBC WITH DIFFERENTIAL/PLATELET
Basophils Absolute: 0 10*3/uL (ref 0.0–0.1)
Basophils Relative: 0.1 % (ref 0.0–3.0)
EOS ABS: 0.1 10*3/uL (ref 0.0–0.7)
Eosinophils Relative: 1.2 % (ref 0.0–5.0)
HCT: 43.2 % (ref 36.0–46.0)
HEMOGLOBIN: 14.4 g/dL (ref 12.0–15.0)
Lymphocytes Relative: 23.8 % (ref 12.0–46.0)
Lymphs Abs: 1.8 10*3/uL (ref 0.7–4.0)
MCHC: 33.3 g/dL (ref 30.0–36.0)
MCV: 94.4 fl (ref 78.0–100.0)
MONO ABS: 0.5 10*3/uL (ref 0.1–1.0)
Monocytes Relative: 6.2 % (ref 3.0–12.0)
NEUTROS PCT: 68.7 % (ref 43.0–77.0)
Neutro Abs: 5.3 10*3/uL (ref 1.4–7.7)
Platelets: 341 10*3/uL (ref 150.0–400.0)
RBC: 4.58 Mil/uL (ref 3.87–5.11)
RDW: 12.4 % (ref 11.5–15.5)
WBC: 7.7 10*3/uL (ref 4.0–10.5)

## 2015-09-06 ENCOUNTER — Telehealth: Payer: Self-pay | Admitting: Adult Health

## 2015-09-06 NOTE — Telephone Encounter (Signed)
Ms. Tracy May called with an update on her BP readings at the [facility] of [facility]. Over the weekend her readings were: 132/89 and 142/94. This morning her BP was 150/101. She's wondering if the HCTZ is working or if Tracy May thinks she needs to do something else. If you have questions or concerns, please give her a call.  Pt's ph# [phone] Thank you.

## 2015-09-06 NOTE — Telephone Encounter (Signed)
Pt also wanted to leave her work phone number   [phone]

## 2015-09-07 NOTE — Telephone Encounter (Signed)
Left a message for return call.  

## 2015-09-07 NOTE — Telephone Encounter (Signed)
It appears to be working. She may need to go up on her dose. Have her follow up with PCP since he will be managing this.

## 2015-09-07 NOTE — Telephone Encounter (Signed)
Pt returned call. Pt states yesterday her bp was 155/101 and one of the NP's told her she may need to increase her medicaiton; Per pt this morning her bp was 109/78.  Pt sees Dr. Posey ReaPlotnikov next week.  Advised pt to continue to monitor her bp and call her primary care with any further concerns.  Pt verbalized understanding.

## 2015-09-14 ENCOUNTER — Ambulatory Visit (INDEPENDENT_AMBULATORY_CARE_PROVIDER_SITE_OTHER): Payer: 59 | Admitting: Internal Medicine

## 2015-09-14 ENCOUNTER — Encounter: Payer: Self-pay | Admitting: Internal Medicine

## 2015-09-14 VITALS — BP 118/80 | HR 83 | Ht 64.0 in | Wt 164.0 lb

## 2015-09-14 DIAGNOSIS — H8110 Benign paroxysmal vertigo, unspecified ear: Secondary | ICD-10-CM

## 2015-09-14 DIAGNOSIS — Z0000 Encounter for general adult medical examination without abnormal findings: Secondary | ICD-10-CM

## 2015-09-14 DIAGNOSIS — G4700 Insomnia, unspecified: Secondary | ICD-10-CM

## 2015-09-14 MED ORDER — ZOLPIDEM TARTRATE 10 MG PO TABS: 10.0000 mg | ORAL_TABLET | Freq: Every evening | ORAL | Status: DC | PRN

## 2015-09-14 NOTE — Progress Notes (Signed)
Pre visit review using our clinic review tool, if applicable. No additional management support is needed unless otherwise documented below in the visit note. 

## 2015-09-14 NOTE — Progress Notes (Signed)
Subjective:  Patient ID: Tracy May, female    DOB: 1956/02/13  Age: 59 y.o. MRN: [id number]  CC: Annual Exam   HPI Tracy May presents for a well exam. Pt had elev BP - now is on HCTZ  Outpatient Prescriptions Prior to Visit  Medication Sig Dispense Refill  . acetaminophen (APAP EXTRA STRENGTH) 500 MG tablet Take 500 mg by mouth every 6 (six) hours as needed.    . Azelaic Acid (FINACEA) 15 % cream Apply topically 2 (two) times daily. After skin is thoroughly washed and patted dry, gently but thoroughly massage a thin film of azelaic acid cream into the affected area twice daily, in the morning and evening.     . cetirizine (ZYRTEC) 10 MG tablet Take 10 mg by mouth daily.      . diphenhydrAMINE (BENADRYL) 25 MG tablet Take 25 mg by mouth daily as needed.      Marland Kitchen. estradiol (VIVELLE-DOT) 0.0375 MG/24HR Place 1 patch onto the skin 2 (two) times a week.    . hydrochlorothiazide (MICROZIDE) 12.5 MG capsule Take 1 capsule (12.5 mg total) by mouth daily. 90 capsule 3  . loratadine (CLARITIN) 10 MG tablet Take 10 mg by mouth daily.      . meclizine (ANTIVERT) 12.5 MG tablet TAKE 1-2 TABLETS BY MOUTH 3 TIMES DAILY AS NEEDED 100 tablet 1  . Multiple Vitamin (DAILY MULTIVITAMIN PO) Take 1 tablet by mouth daily.    Marland Kitchen. zolpidem (AMBIEN) 10 MG tablet Take 1 tablet (10 mg total) by mouth at bedtime as needed. for sleep 90 tablet 1  . FIBER PO Take by mouth as needed. Reported on 09/14/2015    . Cholecalciferol 1000 UNITS capsule Take 1,000 Units by mouth daily. Reported on 09/14/2015     No facility-administered medications prior to visit.    ROS Review of Systems  Constitutional: Negative for chills, activity change, appetite change, fatigue and unexpected weight change.  HENT: Negative for congestion, mouth sores and sinus pressure.   Eyes: Negative for visual disturbance.  Respiratory: Negative for cough and chest tightness.   Gastrointestinal: Negative for nausea and abdominal pain.    Genitourinary: Negative for frequency, difficulty urinating and vaginal pain.  Musculoskeletal: Negative for back pain, gait problem and neck pain.  Skin: Negative for pallor and rash.  Neurological: Negative for dizziness, tremors, weakness, numbness and headaches.  Psychiatric/Behavioral: Negative for suicidal ideas, confusion and sleep disturbance. The patient is not nervous/anxious.     Objective:  BP 118/80 mmHg  Pulse 83  Ht 5\' 4"  (1.626 m)  Wt 164 lb (74.39 kg)  BMI 28.14 kg/m2  SpO2 96%  BP Readings from Last 3 Encounters:  09/14/15 118/80  08/31/15 160/108  08/26/14 140/80    Wt Readings from Last 3 Encounters:  09/14/15 164 lb (74.39 kg)  08/31/15 167 lb 1.6 oz (75.796 kg)  08/26/14 157 lb (71.215 kg)    Physical Exam  Constitutional: She appears well-developed. No distress.  HENT:  Head: Normocephalic.  Right Ear: External ear normal.  Left Ear: External ear normal.  Nose: Nose normal.  Mouth/Throat: Oropharynx is clear and moist.  Eyes: Conjunctivae are normal. Pupils are equal, round, and reactive to light. Right eye exhibits no discharge. Left eye exhibits no discharge.  Neck: Normal range of motion. Neck supple. No JVD present. No tracheal deviation present. No thyromegaly present.  Cardiovascular: Normal rate, regular rhythm and normal heart sounds.   Pulmonary/Chest: No stridor. No respiratory distress. She has  no wheezes.  Abdominal: Soft. Bowel sounds are normal. She exhibits no distension and no mass. There is no tenderness. There is no rebound and no guarding.  Musculoskeletal: She exhibits no edema or tenderness.  Lymphadenopathy:    She has no cervical adenopathy.  Neurological: She displays normal reflexes. No cranial nerve deficit. She exhibits normal muscle tone. Coordination normal.  Skin: No rash noted. No erythema.  Psychiatric: She has a normal mood and affect. Her behavior is normal. Judgment and thought content normal.    Lab Results   Component Value Date   WBC 7.7 08/31/2015   HGB 14.4 08/31/2015   HCT 43.2 08/31/2015   PLT 341.0 08/31/2015   GLUCOSE 92 08/31/2015   CHOL 255* 08/26/2014   TRIG 54.0 08/26/2014   HDL 101.10 08/26/2014   LDLDIRECT 102.3 10/16/2012   LDLCALC 143* 08/26/2014   ALT 19 08/26/2014   AST 22 08/26/2014   NA 137 08/31/2015   K 4.6 08/31/2015   CL 99 08/31/2015   CREATININE 0.76 08/31/2015   BUN 16 08/31/2015   CO2 28 08/31/2015   TSH 0.70 08/26/2014    Dg Chest 2 View  09/30/2013  CLINICAL DATA:  Wheezing, history of reactive airway disease, prior history of tobacco use. EXAM: CHEST  2 VIEW COMPARISON:  PA and lateral chest x-ray of March 23, 2011 FINDINGS: The lungs are mildly hyperinflated. There is no focal infiltrate. The interstitial markings are minimally prominent though stable possibly related to the previous tobacco use. The cardiac silhouette is normal in size. The mediastinum is normal in width. There is no pleural effusion or pneumothorax. The observed portions of the bony thorax appear normal. IMPRESSION: There is mild hyperinflation consistent with COPD or reactive airway disease. There is no evidence of pneumonia nor CHF. One cannot exclude acute bronchitis in the appropriate clinical setting. Electronically Signed   By: David  [place]   On: 09/30/2013 14:43    Assessment & Plan:   Jarelly was seen today for annual exam.  Diagnoses and all orders for this visit:  INSOMNIA, PERSISTENT  Benign paroxysmal positional vertigo, unspecified laterality  Well adult exam  Other orders -     zolpidem (AMBIEN) 10 MG tablet; Take 1 tablet (10 mg total) by mouth at bedtime as needed. for sleep  I am having [name] maintain her diphenhydrAMINE, loratadine, Azelaic Acid, cetirizine, FIBER PO, Multiple Vitamin (DAILY MULTIVITAMIN PO), acetaminophen, estradiol, meclizine, hydrochlorothiazide, Cholecalciferol, Biotin, and zolpidem.  Meds ordered this encounter  Medications  .  Cholecalciferol 2000 UNITS TABS    Sig: Take 1 tablet by mouth daily.  . Biotin (BIOTIN MAXIMUM STRENGTH) 10 MG TABS    Sig: Take 1 tablet by mouth daily.  Marland Kitchen zolpidem (AMBIEN) 10 MG tablet    Sig: Take 1 tablet (10 mg total) by mouth at bedtime as needed. for sleep    Dispense:  90 tablet    Refill:  1    This request is for a new prescription for a controlled substance as required by Federal/State law..     Follow-up: No Follow-up on file.  Sonda Primes, MD

## 2015-09-14 NOTE — Assessment & Plan Note (Signed)
Zolpidem prn  Potential benefits of a long term benzodiazepines  use as well as potential risks  and complications were explained to the patient and were aknowledged. 

## 2015-09-14 NOTE — Assessment & Plan Note (Signed)
Meclizine prn 

## 2015-09-14 NOTE — Assessment & Plan Note (Signed)
Colon due at 59 yo We discussed age appropriate health related issues, including available/recomended screening tests and vaccinations. We discussed a need for adhering to healthy diet and exercise. Labs/EKG were reviewed/ordered. All questions were answered.

## 2015-09-17 ENCOUNTER — Other Ambulatory Visit (INDEPENDENT_AMBULATORY_CARE_PROVIDER_SITE_OTHER): Payer: 59

## 2015-09-17 DIAGNOSIS — Z0000 Encounter for general adult medical examination without abnormal findings: Secondary | ICD-10-CM

## 2015-09-17 LAB — URINALYSIS, ROUTINE W REFLEX MICROSCOPIC
BILIRUBIN URINE: NEGATIVE
KETONES UR: 15 — AB
LEUKOCYTES UA: NEGATIVE
NITRITE: NEGATIVE
PH: 6 (ref 5.0–8.0)
SPECIFIC GRAVITY, URINE: 1.02 (ref 1.000–1.030)
TOTAL PROTEIN, URINE-UPE24: NEGATIVE
URINE GLUCOSE: NEGATIVE
UROBILINOGEN UA: 0.2 (ref 0.0–1.0)

## 2015-09-17 LAB — LIPID PANEL
CHOL/HDL RATIO: 3
Cholesterol: 277 mg/dL — ABNORMAL HIGH (ref 0–200)
HDL: 110.4 mg/dL (ref >39.00)
LDL Cholesterol: 151 mg/dL — ABNORMAL HIGH (ref 0–99)
NonHDL: 166.74
TRIGLYCERIDES: 78 mg/dL (ref 0.0–149.0)
VLDL: 15.6 mg/dL (ref 0.0–40.0)

## 2015-09-17 LAB — HEPATIC FUNCTION PANEL
ALT: 15 U/L (ref 0–35)
AST: 15 U/L (ref 0–37)
Albumin: 4.3 g/dL (ref 3.5–5.2)
Alkaline Phosphatase: 56 U/L (ref 39–117)
BILIRUBIN DIRECT: 0 mg/dL (ref 0.0–0.3)
BILIRUBIN TOTAL: 0.5 mg/dL (ref 0.2–1.2)
Total Protein: 7.4 g/dL (ref 6.0–8.3)

## 2015-09-17 LAB — HEPATITIS C ANTIBODY: HCV Ab: NEGATIVE

## 2015-09-17 LAB — TSH: TSH: 1.42 u[IU]/mL (ref 0.35–4.50)

## 2015-09-26 DIAGNOSIS — G243 Spasmodic torticollis: Secondary | ICD-10-CM

## 2015-09-26 HISTORY — DX: Spasmodic torticollis: G24.3

## 2015-12-09 ENCOUNTER — Other Ambulatory Visit: Payer: Self-pay | Admitting: *Deleted

## 2015-12-09 MED ORDER — MECLIZINE HCL 12.5 MG PO TABS: ORAL_TABLET | ORAL | Status: DC

## 2015-12-09 NOTE — Telephone Encounter (Signed)
Left msg on triage pt is new to pharmacy she is wanting to get a refill on her meclizine 12.5 mg. Sent electronically to [facility].....Raechel Chute/lmb

## 2015-12-23 ENCOUNTER — Telehealth: Payer: Self-pay | Admitting: *Deleted

## 2015-12-23 NOTE — Telephone Encounter (Signed)
I called pt to see if she has received the 2016-17 Flu vaccine. No answer. Left message to call back if she would like to receive vaccine today or tomorrow 12/24/15.

## 2016-03-06 ENCOUNTER — Telehealth: Payer: Self-pay | Admitting: Internal Medicine

## 2016-03-06 NOTE — Telephone Encounter (Signed)
OK to fill this prescription with additional refills x3 Thank you!  

## 2016-03-06 NOTE — Telephone Encounter (Signed)
Requesting refill on zolpidem.  Needs to go to [facility] on [address].

## 2016-03-07 MED ORDER — ZOLPIDEM TARTRATE 10 MG PO TABS: 10.0000 mg | ORAL_TABLET | Freq: Every evening | ORAL | Status: DC | PRN

## 2016-03-07 NOTE — Telephone Encounter (Signed)
Called refill into [facility] had to leave on pharmacy vm.../lmb 

## 2016-04-05 ENCOUNTER — Encounter: Payer: Self-pay | Admitting: Internal Medicine

## 2016-04-10 ENCOUNTER — Other Ambulatory Visit: Payer: Self-pay | Admitting: *Deleted

## 2016-04-10 NOTE — Telephone Encounter (Signed)
Receive call pt states she is needing refill on her Zolpidem. Inform pt per chart should have refills rx from last month MD gave # 30  W/3 refills. Pt states pharmacy did not give the refills on the bottle its stating needing authorization. Called [facility] spoke w/Jennifer/pharmacist verified if rx was filled that was sent in on 6/17, Per pharmacist she states she does not see script. Gave verbal authorization for remaining refills from the script 6/13...Raechel Chute/lmb

## 2016-04-27 LAB — HM COLONOSCOPY

## 2016-05-30 ENCOUNTER — Other Ambulatory Visit: Payer: Self-pay | Admitting: *Deleted

## 2016-05-30 MED ORDER — MECLIZINE HCL 12.5 MG PO TABS: ORAL_TABLET | ORAL | 0 refills | Status: DC

## 2016-07-05 ENCOUNTER — Telehealth: Payer: Self-pay | Admitting: *Deleted

## 2016-07-05 ENCOUNTER — Encounter: Payer: Self-pay | Admitting: Internal Medicine

## 2016-07-05 NOTE — Telephone Encounter (Signed)
rec'd fax pt requesting refill on Zolpidem 10 mg. Last filled 06/06/16...Raechel Chute/lmb

## 2016-07-05 NOTE — Telephone Encounter (Signed)
OK to fill this prescription with additional refills x3 Thank you!  

## 2016-07-06 ENCOUNTER — Encounter: Payer: Self-pay | Admitting: Internal Medicine

## 2016-07-06 MED ORDER — ZOLPIDEM TARTRATE 10 MG PO TABS
10.0000 mg | ORAL_TABLET | Freq: Every evening | ORAL | 3 refills | Status: DC | PRN
Start: 2016-07-06 — End: 2017-01-01

## 2016-07-06 NOTE — Telephone Encounter (Signed)
Called refill into [facility] had to leave on pharmacy vm.../lmb 

## 2016-11-07 ENCOUNTER — Encounter: Payer: Self-pay | Admitting: Internal Medicine

## 2016-11-07 ENCOUNTER — Ambulatory Visit (INDEPENDENT_AMBULATORY_CARE_PROVIDER_SITE_OTHER): Payer: 59 | Admitting: Internal Medicine

## 2016-11-07 ENCOUNTER — Ambulatory Visit (INDEPENDENT_AMBULATORY_CARE_PROVIDER_SITE_OTHER)
Admission: RE | Admit: 2016-11-07 | Discharge: 2016-11-07 | Disposition: A | Discharge status: Home / Self Care | Payer: 59 | Source: Ambulatory Visit | Attending: Internal Medicine | Admitting: Internal Medicine

## 2016-11-07 VITALS — BP 124/82 | HR 66 | Temp 98.1°F | Resp 16 | Ht 63.5 in | Wt 160.8 lb

## 2016-11-07 DIAGNOSIS — J45909 Unspecified asthma, uncomplicated: Secondary | ICD-10-CM | POA: Insufficient documentation

## 2016-11-07 DIAGNOSIS — J4520 Mild intermittent asthma, uncomplicated: Secondary | ICD-10-CM

## 2016-11-07 DIAGNOSIS — R0602 Shortness of breath: Secondary | ICD-10-CM | POA: Diagnosis not present

## 2016-11-07 DIAGNOSIS — G250 Essential tremor: Secondary | ICD-10-CM

## 2016-11-07 DIAGNOSIS — R42 Dizziness and giddiness: Secondary | ICD-10-CM | POA: Diagnosis not present

## 2016-11-07 DIAGNOSIS — Z0000 Encounter for general adult medical examination without abnormal findings: Secondary | ICD-10-CM | POA: Diagnosis not present

## 2016-11-07 DIAGNOSIS — G4700 Insomnia, unspecified: Secondary | ICD-10-CM

## 2016-11-07 MED ORDER — ALBUTEROL SULFATE HFA 108 (90 BASE) MCG/ACT IN AERS
2.0000 | INHALATION_SPRAY | Freq: Four times a day (QID) | RESPIRATORY_TRACT | 6 refills | Status: DC | PRN
Start: 2016-11-07 — End: 2017-05-18

## 2016-11-07 NOTE — Progress Notes (Signed)
Pre-visit discussion using our clinic review tool. No additional management support is needed unless otherwise documented below in the visit note.  

## 2016-11-07 NOTE — Assessment & Plan Note (Signed)
Zolpidem prn  Potential benefits of a long term benzodiazepines  use as well as potential risks  and complications were explained to the patient and were aknowledged. 

## 2016-11-07 NOTE — Assessment & Plan Note (Signed)
Neurol ref Dr Arbutus [facility]

## 2016-11-07 NOTE — Progress Notes (Signed)
Subjective:  Patient ID: Tracy BoyerRebecca L Hemrick, female    DOB: 01-12-1956  Age: 61 y.o. MRN: [id number]  CC: Annual Exam (dizziness, SOB )   HPI Tracy May presents for a well exam C/o occ BPV  Outpatient Medications Prior to Visit  Medication Sig Dispense Refill  . acetaminophen (APAP EXTRA STRENGTH) 500 MG tablet Take 500 mg by mouth every 6 (six) hours as needed.    . Azelaic Acid (FINACEA) 15 % cream Apply topically 2 (two) times daily. After skin is thoroughly washed and patted dry, gently but thoroughly massage a thin film of azelaic acid cream into the affected area twice daily, in the morning and evening.     . Biotin (BIOTIN MAXIMUM STRENGTH) 10 MG TABS Take 1 tablet by mouth daily.    . cetirizine (ZYRTEC) 10 MG tablet Take 10 mg by mouth daily.      . Cholecalciferol 2000 UNITS TABS Take 1 tablet by mouth daily.    Marland Kitchen. zolpidem (AMBIEN) 10 MG tablet Take 1 tablet (10 mg total) by mouth at bedtime as needed. for sleep 30 tablet 3  . diphenhydrAMINE (BENADRYL) 25 MG tablet Take 25 mg by mouth daily as needed.      Marland Kitchen. estradiol (VIVELLE-DOT) 0.0375 MG/24HR Place 1 patch onto the skin 2 (two) times a week.    Marland Kitchen. FIBER PO Take by mouth as needed. Reported on 09/14/2015    . hydrochlorothiazide (MICROZIDE) 12.5 MG capsule Take 1 capsule (12.5 mg total) by mouth daily. 90 capsule 3  . loratadine (CLARITIN) 10 MG tablet Take 10 mg by mouth daily.      . meclizine (ANTIVERT) 12.5 MG tablet TAKE 1-2 TABLETS BY MOUTH 3 TIMES DAILY AS NEEDED 100 tablet 0  . Multiple Vitamin (DAILY MULTIVITAMIN PO) Take 1 tablet by mouth daily.     No facility-administered medications prior to visit.     ROS Review of Systems  Constitutional: Negative for activity change, appetite change, chills, fatigue and unexpected weight change.  HENT: Negative for congestion, mouth sores and sinus pressure.   Eyes: Negative for visual disturbance.  Respiratory: Negative for cough and chest tightness.     Gastrointestinal: Negative for abdominal pain and nausea.  Genitourinary: Negative for difficulty urinating, frequency and vaginal pain.  Musculoskeletal: Negative for back pain and gait problem.  Skin: Negative for pallor and rash.  Neurological: Positive for dizziness. Negative for tremors, weakness, numbness and headaches.  Psychiatric/Behavioral: Negative for confusion, sleep disturbance and suicidal ideas.    Objective:  BP 124/82   Pulse 66   Temp 98.1 F (36.7 C) (Oral)   Resp 16   Ht 5' 3.5" (1.613 m)   Wt 160 lb 12 oz (72.9 kg)   SpO2 98%   BMI 28.03 kg/m   BP Readings from Last 3 Encounters:  11/07/16 124/82  09/14/15 118/80  08/31/15 (!) 160/108    Wt Readings from Last 3 Encounters:  11/07/16 160 lb 12 oz (72.9 kg)  09/14/15 164 lb (74.4 kg)  08/31/15 167 lb 1.6 oz (75.8 kg)    Physical Exam  Constitutional: She appears well-developed. No distress.  HENT:  Head: Normocephalic.  Right Ear: External ear normal.  Left Ear: External ear normal.  Nose: Nose normal.  Mouth/Throat: Oropharynx is clear and moist.  Eyes: Conjunctivae are normal. Pupils are equal, round, and reactive to light. Right eye exhibits no discharge. Left eye exhibits no discharge.  Neck: Normal range of motion. Neck supple. No JVD  present. No tracheal deviation present. No thyromegaly present.  Cardiovascular: Normal rate, regular rhythm and normal heart sounds.   Pulmonary/Chest: No stridor. No respiratory distress. She has no wheezes.  Abdominal: Soft. Bowel sounds are normal. She exhibits no distension and no mass. There is no tenderness. There is no rebound and no guarding.  Musculoskeletal: She exhibits no edema or tenderness.  Lymphadenopathy:    She has no cervical adenopathy.  Neurological: She displays normal reflexes. No cranial nerve deficit. She exhibits normal muscle tone. Coordination normal.  Skin: No rash noted. No erythema.  Psychiatric: She has a normal mood and  affect. Her behavior is normal. Judgment and thought content normal.  mild head tremor at rest  Procedure: EKG Indication: well exam, h/o EKG changes Impression: NSR. No new changes.   Lab Results  Component Value Date   WBC 7.7 08/31/2015   HGB 14.4 08/31/2015   HCT 43.2 08/31/2015   PLT 341.0 08/31/2015   GLUCOSE 92 08/31/2015   CHOL 277 (H) 09/17/2015   TRIG 78.0 09/17/2015   HDL 110.40 09/17/2015   LDLDIRECT 102.3 10/16/2012   LDLCALC 151 (H) 09/17/2015   ALT 15 09/17/2015   AST 15 09/17/2015   NA 137 08/31/2015   K 4.6 08/31/2015   CL 99 08/31/2015   CREATININE 0.76 08/31/2015   BUN 16 08/31/2015   CO2 28 08/31/2015   TSH 1.42 09/17/2015    Dg Chest 2 View  Result Date: 09/30/2013 CLINICAL DATA:  Wheezing, history of reactive airway disease, prior history of tobacco use. EXAM: CHEST  2 VIEW COMPARISON:  PA and lateral chest x-ray of March 23, 2011 FINDINGS: The lungs are mildly hyperinflated. There is no focal infiltrate. The interstitial markings are minimally prominent though stable possibly related to the previous tobacco use. The cardiac silhouette is normal in size. The mediastinum is normal in width. There is no pleural effusion or pneumothorax. The observed portions of the bony thorax appear normal. IMPRESSION: There is mild hyperinflation consistent with COPD or reactive airway disease. There is no evidence of pneumonia nor CHF. One cannot exclude acute bronchitis in the appropriate clinical setting. Electronically Signed   By: David  [place]   On: 09/30/2013 14:43    Assessment & Plan:   There are no diagnoses linked to this encounter. I have discontinued Ms. Herter diphenhydrAMINE, loratadine, FIBER PO, Multiple Vitamin (DAILY MULTIVITAMIN PO), hydrochlorothiazide, and meclizine. I am also having her maintain her Azelaic Acid, cetirizine, acetaminophen, Cholecalciferol, Biotin, zolpidem, and estradiol.  Meds ordered this encounter  Medications  . estradiol  (MINIVELLE) 0.05 MG/24HR patch    Sig: Place 1 patch onto the skin 2 (two) times a week.     Follow-up: No Follow-up on file.  Sonda Primes, MD

## 2016-11-07 NOTE — Assessment & Plan Note (Signed)
Proair prn 

## 2016-11-07 NOTE — Assessment & Plan Note (Signed)
Benign Positional Vertigo symptoms. PRN Meclizine. Start Francee PiccoloBrandt - Daroff exercise qd-several times a day as dirrected.

## 2016-11-07 NOTE — Assessment & Plan Note (Signed)
We discussed age appropriate health related issues, including available/recomended screening tests and vaccinations. We discussed a need for adhering to healthy diet and exercise. Labs were ordered. All questions were answered. 

## 2016-11-10 ENCOUNTER — Other Ambulatory Visit (INDEPENDENT_AMBULATORY_CARE_PROVIDER_SITE_OTHER): Payer: 59

## 2016-11-10 DIAGNOSIS — Z0000 Encounter for general adult medical examination without abnormal findings: Secondary | ICD-10-CM

## 2016-11-10 LAB — HEPATIC FUNCTION PANEL
ALBUMIN: 4 g/dL (ref 3.5–5.2)
ALT: 15 U/L (ref 0–35)
AST: 12 U/L (ref 0–37)
Alkaline Phosphatase: 49 U/L (ref 39–117)
BILIRUBIN TOTAL: 0.3 mg/dL (ref 0.2–1.2)
Bilirubin, Direct: 0 mg/dL (ref 0.0–0.3)
TOTAL PROTEIN: 6.6 g/dL (ref 6.0–8.3)

## 2016-11-10 LAB — URINALYSIS
BILIRUBIN URINE: NEGATIVE
HGB URINE DIPSTICK: NEGATIVE
Ketones, ur: NEGATIVE
LEUKOCYTES UA: NEGATIVE
Nitrite: NEGATIVE
Specific Gravity, Urine: 1.01 (ref 1.000–1.030)
TOTAL PROTEIN, URINE-UPE24: NEGATIVE
UROBILINOGEN UA: 0.2 (ref 0.0–1.0)
Urine Glucose: NEGATIVE
pH: 7 (ref 5.0–8.0)

## 2016-11-10 LAB — BASIC METABOLIC PANEL
BUN: 15 mg/dL (ref 6–23)
CO2: 27 mEq/L (ref 19–32)
CREATININE: 0.82 mg/dL (ref 0.40–1.20)
Calcium: 9.3 mg/dL (ref 8.4–10.5)
Chloride: 105 mEq/L (ref 96–112)
GFR: 91.75 mL/min (ref >60.00)
GLUCOSE: 96 mg/dL (ref 70–99)
Potassium: 4.1 mEq/L (ref 3.5–5.1)
Sodium: 139 mEq/L (ref 135–145)

## 2016-11-10 LAB — CBC WITH DIFFERENTIAL/PLATELET
BASOS ABS: 0 10*3/uL (ref 0.0–0.1)
BASOS PCT: 0.7 % (ref 0.0–3.0)
EOS ABS: 0.1 10*3/uL (ref 0.0–0.7)
Eosinophils Relative: 2.5 % (ref 0.0–5.0)
HEMATOCRIT: 40.5 % (ref 36.0–46.0)
HEMOGLOBIN: 13.8 g/dL (ref 12.0–15.0)
LYMPHS PCT: 30.9 % (ref 12.0–46.0)
Lymphs Abs: 1.7 10*3/uL (ref 0.7–4.0)
MCHC: 34 g/dL (ref 30.0–36.0)
MCV: 92.9 fl (ref 78.0–100.0)
MONOS PCT: 6.2 % (ref 3.0–12.0)
Monocytes Absolute: 0.3 10*3/uL (ref 0.1–1.0)
NEUTROS ABS: 3.2 10*3/uL (ref 1.4–7.7)
Neutrophils Relative %: 59.7 % (ref 43.0–77.0)
PLATELETS: 337 10*3/uL (ref 150.0–400.0)
RBC: 4.37 Mil/uL (ref 3.87–5.11)
RDW: 12.6 % (ref 11.5–15.5)
WBC: 5.4 10*3/uL (ref 4.0–10.5)

## 2016-11-10 LAB — LIPID PANEL
CHOLESTEROL: 219 mg/dL — AB (ref 0–200)
HDL: 76.6 mg/dL (ref >39.00)
LDL Cholesterol: 135 mg/dL — ABNORMAL HIGH (ref 0–99)
NonHDL: 142.59
TRIGLYCERIDES: 40 mg/dL (ref 0.0–149.0)
Total CHOL/HDL Ratio: 3
VLDL: 8 mg/dL (ref 0.0–40.0)

## 2016-11-10 LAB — TSH: TSH: 1.19 u[IU]/mL (ref 0.35–4.50)

## 2016-11-10 NOTE — Progress Notes (Signed)
Subjective:   Tracy May was seen in consultation in the movement disorder clinic at the request of Sonda Primes, MD.  The evaluation is for tremor.  Tremor started approximately 6 months ago and involves the head.  She first noted that head was pulling to the left.  She notices it in the "no" direction.  She does have chronic neck pain.  She notes that only trying to be still will make it worse.  Anger/emotion doesn't seem to change it.  There is no family hx of tremor.    Affected by caffeine:  unknown (4 cups per day)  Current/Previously tried tremor medications: no  Current medications that may exacerbate tremor:  n/a  Outside reports reviewed: historical medical records, lab reports, office notes and referral letter/letters.  MRI brain done in 07/2006 was normal.  Allergies  Allergen Reactions  . Azithromycin     REACTION: nausea and stomach pains  . Sulfonamide Derivatives     Outpatient Encounter Prescriptions as of 11/13/2016  Medication Sig  . acetaminophen (APAP EXTRA STRENGTH) 500 MG tablet Take 500 mg by mouth every 6 (six) hours as needed.  Marland Kitchen albuterol (PROAIR HFA) 108 (90 Base) MCG/ACT inhaler Inhale 2 puffs into the lungs every 6 (six) hours as needed for wheezing or shortness of breath.  . Azelaic Acid (FINACEA) 15 % cream Apply topically 2 (two) times daily. After skin is thoroughly washed and patted dry, gently but thoroughly massage a thin film of azelaic acid cream into the affected area twice daily, in the morning and evening.   . Biotin (BIOTIN MAXIMUM STRENGTH) 10 MG TABS Take 1 tablet by mouth daily.  . cetirizine (ZYRTEC) 10 MG tablet Take 10 mg by mouth daily.    . Cholecalciferol 2000 UNITS TABS Take 1 tablet by mouth daily.  Marland Kitchen estradiol (MINIVELLE) 0.05 MG/24HR patch Place 1 patch onto the skin 2 (two) times a week.  . Magnesium 250 MG TABS Take by mouth daily.  . meclizine (ANTIVERT) 12.5 MG tablet Take 12.5 mg by mouth as needed for dizziness.  Marland Kitchen  zolpidem (AMBIEN) 10 MG tablet Take 1 tablet (10 mg total) by mouth at bedtime as needed. for sleep   No facility-administered encounter medications on file as of 11/13/2016.     Past Medical History:  Diagnosis Date  . Acne   . Allergic rhinitis   . GERD (gastroesophageal reflux disease)   . Vertigo     Past Surgical History:  Procedure Laterality Date  . ABDOMINAL HYSTERECTOMY    . BUNIONECTOMY Right   . ECTOPIC PREGNANCY SURGERY    . MYOMECTOMY    . WISDOM TOOTH EXTRACTION      Social History   Social History  . Marital status: Married    Spouse name: N/A  . Number of children: N/A  . Years of education: N/A   Occupational History  . CMA     clinic manager   Social History Main Topics  . Smoking status: Former Smoker    Quit date: 11/13/1996  . Smokeless tobacco: Never Used  . Alcohol use Yes     Comment: glass of wine a month  . Drug use: No  . Sexual activity: Not on file   Other Topics Concern  . Not on file   Social History Narrative   Regular Exercise- yes    Family Status  Relation Status  . Mother Deceased  . Sister Deceased  . Father Deceased  . Sister Alive  .  Brother Alive  . Daughter Alive    Review of Systems A complete 10 system ROS was obtained and was negative apart from what is mentioned.   Objective:   VITALS:   Vitals:   11/13/16 0903  BP: 108/68  Pulse: 62  Weight: 161 lb (73 kg)  Height: 5\' 4"  (1.626 m)   Gen:  Appears stated age and in NAD. HEENT:  Normocephalic, atraumatic. The mucous membranes are moist. The superficial temporal arteries are without ropiness or tenderness.  The head is turned to the L and there is slight hypertrophy of the right SCM.  R ear slightly approximates the right shoulder.  She exhibits geste antagoniste.   Cardiovascular: Regular rate and rhythm. Lungs: Clear to auscultation bilaterally. Neck: There are no carotid bruits noted bilaterally.  NEUROLOGICAL:  Orientation:  The patient is  alert and oriented x 3.  Recent and remote memory are intact.  Attention span and concentration are normal.  Able to name objects and repeat without trouble.  Fund of knowledge is appropriate Cranial nerves: There is good facial symmetry. The pupils are equal round and reactive to light bilaterally. Fundoscopic exam reveals clear disc margins bilaterally. Extraocular muscles are intact and visual fields are full to confrontational testing. Speech is fluent and clear. Soft palate rises symmetrically and there is no tongue deviation. Hearing is intact to conversational tone. Tone: Tone is good throughout. Sensation: Sensation is intact to light touch and pinprick throughout (facial, trunk, extremities). Vibration is intact at the bilateral big toe. There is no extinction with double simultaneous stimulation. There is no sensory dermatomal level identified. Coordination:  The patient has no dysdiadichokinesia or dysmetria. Motor: Strength is 5/5 in the bilateral upper and lower extremities.  Shoulder shrug is equal bilaterally.  There is no pronator drift.  There are no fasciculations noted. DTR's: Deep tendon reflexes are 2/4 at the bilateral biceps, triceps, brachioradialis, patella and achilles.  Plantar responses are downgoing bilaterally. Gait and Station: The patient is able to ambulate without difficulty. The patient is able to heel toe walk without any difficulty. The patient is able to ambulate in a tandem fashion. The patient is able to stand in the Romberg position.      Assessment/Plan:   1.  Cervical Dystonia  -I talked to the patient about the nature and pathophysiology.  The primary muscles involved are the right SCM, right levator scapulae, left splenius capitus  .  We talked about treatments.  We talked about the value of botox.  The patient was educated on the botulinum toxin the black blox warning and given a copy of the botox patient medication guide.  The patient understands that this  warning states that there have been reported cases of the Botox extending beyond the injection site and creating adverse effects, similar to those of botulism. This included loss of strength, trouble walking, hoarseness, trouble saying words clearly, loss of bladder control, trouble breathing, trouble swallowing, diplopia, blurry vision and ptosis. Most of the distant spread of Botox was happening in patients, primarily children, who received medication for spasticity or for cervical dystonia. The patient expressed understanding and wants to think about whether or not to proceed.  If we do we would hold levator for now given increased swallowing trouble risk and start with other mm.  Talked about trying klonopin but would not recommend this route first  -do MRI cervical spine given short course and neck pain.  Will call with results  2.  Much greater  than 50% of this visit was spent in counseling and coordinating care.  Total face to face time:  60 min    CC:  Sonda Primes, MD

## 2016-11-12 ENCOUNTER — Encounter: Payer: Self-pay | Admitting: Internal Medicine

## 2016-11-13 ENCOUNTER — Ambulatory Visit (INDEPENDENT_AMBULATORY_CARE_PROVIDER_SITE_OTHER): Payer: 59 | Admitting: Neurology

## 2016-11-13 ENCOUNTER — Encounter: Payer: Self-pay | Admitting: Neurology

## 2016-11-13 VITALS — BP 108/68 | HR 62 | Ht 64.0 in | Wt 161.0 lb

## 2016-11-13 DIAGNOSIS — G243 Spasmodic torticollis: Secondary | ICD-10-CM | POA: Diagnosis not present

## 2016-11-13 DIAGNOSIS — M542 Cervicalgia: Secondary | ICD-10-CM

## 2016-11-16 DIAGNOSIS — N6320 Unspecified lump in the left breast, unspecified quadrant: Secondary | ICD-10-CM | POA: Diagnosis not present

## 2016-11-16 LAB — HM MAMMOGRAPHY

## 2016-11-17 ENCOUNTER — Encounter: Payer: Self-pay | Admitting: Neurology

## 2016-11-17 ENCOUNTER — Encounter: Payer: Self-pay | Admitting: Internal Medicine

## 2016-11-17 DIAGNOSIS — D649 Anemia, unspecified: Secondary | ICD-10-CM

## 2016-11-20 NOTE — Telephone Encounter (Signed)
Spoke with patient and she will have labs drawn Wednesday. Orders placed and she was scheduled.

## 2016-11-22 ENCOUNTER — Other Ambulatory Visit (INDEPENDENT_AMBULATORY_CARE_PROVIDER_SITE_OTHER): Payer: 59

## 2016-11-22 ENCOUNTER — Telehealth: Payer: Self-pay | Admitting: Neurology

## 2016-11-22 ENCOUNTER — Other Ambulatory Visit: Payer: 59

## 2016-11-22 DIAGNOSIS — D649 Anemia, unspecified: Secondary | ICD-10-CM

## 2016-11-22 LAB — IBC PANEL
Iron: 80 ug/dL (ref 42–145)
Saturation Ratios: 27.6 % (ref 20.0–50.0)
TRANSFERRIN: 207 mg/dL — AB (ref 212.0–360.0)

## 2016-11-22 LAB — FERRITIN: Ferritin: 51.5 ng/mL (ref 10.0–291.0)

## 2016-11-22 NOTE — Telephone Encounter (Signed)
-----   Message from Octaviano Battyebecca S Tat, DO sent at 11/22/2016  1:44 PM EST ----- Tell patient her iron is okay.  If wants to be on meds for RLS, would rather do f/u just so that she fully understands r/b/se of these since we didn't discuss rls at visit

## 2016-11-22 NOTE — Telephone Encounter (Signed)
Mychart message sent to patient.

## 2016-11-25 ENCOUNTER — Ambulatory Visit
Admission: RE | Admit: 2016-11-25 | Discharge: 2016-11-25 | Disposition: A | Discharge status: Home / Self Care | Payer: 59 | Source: Ambulatory Visit | Attending: Neurology | Admitting: Neurology

## 2016-11-25 DIAGNOSIS — G243 Spasmodic torticollis: Secondary | ICD-10-CM

## 2016-11-25 DIAGNOSIS — M542 Cervicalgia: Secondary | ICD-10-CM

## 2016-11-25 DIAGNOSIS — M50223 Other cervical disc displacement at C6-C7 level: Secondary | ICD-10-CM | POA: Diagnosis not present

## 2016-11-27 ENCOUNTER — Encounter: Payer: Self-pay | Admitting: Internal Medicine

## 2016-11-27 ENCOUNTER — Encounter: Payer: Self-pay | Admitting: Neurology

## 2016-11-27 ENCOUNTER — Telehealth: Payer: Self-pay | Admitting: Neurology

## 2016-11-27 NOTE — Telephone Encounter (Signed)
-----   Message from Octaviano Battyebecca S Tat, DO sent at 11/27/2016  7:42 AM EST ----- Reviewed and agree.  She does have some disc protrusion but nothing that abuts the spinal cord or accounts for dystonia.  Kailie Polus, let pt know.  Also, tell her that there is a 15 mm thyroid nodule and u/s is recommended.  This is incidental finding and way out of my field.  Can I get her an appt with her PCP for follow up?  I am copying her PCP on this as well.

## 2016-11-27 NOTE — Telephone Encounter (Signed)
Mychart message sent to patient.

## 2016-11-30 ENCOUNTER — Telehealth: Payer: Self-pay | Admitting: Internal Medicine

## 2016-11-30 DIAGNOSIS — E041 Nontoxic single thyroid nodule: Secondary | ICD-10-CM

## 2016-11-30 NOTE — Telephone Encounter (Signed)
Routing to dr plotnikov, please advise, thanks 

## 2016-11-30 NOTE — Telephone Encounter (Signed)
I'll ref to Endocrinology Thx

## 2016-11-30 NOTE — Telephone Encounter (Signed)
Patient states MRI was ordered by Dr. Arlana Pouchate.  A sonogram was suggested from MRI results.  Patient would like to know if she needs to make appointment to come in or if she needs referral to a specialist or can Dr. Macario GoldsPlot order?

## 2016-12-01 ENCOUNTER — Encounter: Payer: Self-pay | Admitting: Neurology

## 2016-12-01 NOTE — Telephone Encounter (Signed)
Left message advising that referral has been placed 

## 2016-12-05 NOTE — Progress Notes (Signed)
Addressed.

## 2016-12-20 ENCOUNTER — Encounter: Payer: Self-pay | Admitting: Neurology

## 2016-12-21 DIAGNOSIS — R300 Dysuria: Secondary | ICD-10-CM | POA: Diagnosis not present

## 2016-12-24 DIAGNOSIS — E041 Nontoxic single thyroid nodule: Secondary | ICD-10-CM

## 2016-12-24 HISTORY — DX: Nontoxic single thyroid nodule: E04.1

## 2016-12-27 ENCOUNTER — Other Ambulatory Visit: Payer: Self-pay | Admitting: Obstetrics and Gynecology

## 2016-12-27 ENCOUNTER — Telehealth: Payer: Self-pay | Admitting: Neurology

## 2016-12-27 DIAGNOSIS — E041 Nontoxic single thyroid nodule: Secondary | ICD-10-CM

## 2016-12-27 NOTE — Telephone Encounter (Signed)
Insurance stated that no prior authorization was necessary.  Tried to call patient with no answer.  Mychart message sent.

## 2016-12-27 NOTE — Telephone Encounter (Signed)
[employer] called regarding her botox Injection  appt on 01/26/17. The CPT code is [id number]. He asked if you could call the patient. Thanks

## 2017-01-01 ENCOUNTER — Ambulatory Visit
Admission: RE | Admit: 2017-01-01 | Discharge: 2017-01-01 | Disposition: A | Discharge status: Home / Self Care | Payer: 59 | Source: Ambulatory Visit | Attending: Obstetrics and Gynecology | Admitting: Obstetrics and Gynecology

## 2017-01-01 ENCOUNTER — Other Ambulatory Visit: Payer: Self-pay

## 2017-01-01 DIAGNOSIS — E041 Nontoxic single thyroid nodule: Secondary | ICD-10-CM

## 2017-01-01 DIAGNOSIS — E042 Nontoxic multinodular goiter: Secondary | ICD-10-CM | POA: Diagnosis not present

## 2017-01-01 MED ORDER — ZOLPIDEM TARTRATE 10 MG PO TABS: 10.0000 mg | ORAL_TABLET | Freq: Every evening | ORAL | 3 refills | Status: DC | PRN

## 2017-01-01 NOTE — Telephone Encounter (Signed)
Rec'd fax from [facility] for Zolpidem 

## 2017-01-02 NOTE — Telephone Encounter (Signed)
[facility] sent to [facility] and [place] pt will call [facility] to see if OB sent in RX

## 2017-01-03 ENCOUNTER — Other Ambulatory Visit: Payer: Self-pay | Admitting: Obstetrics and Gynecology

## 2017-01-03 DIAGNOSIS — E041 Nontoxic single thyroid nodule: Secondary | ICD-10-CM

## 2017-01-09 ENCOUNTER — Ambulatory Visit
Admission: RE | Admit: 2017-01-09 | Discharge: 2017-01-09 | Disposition: A | Discharge status: Home / Self Care | Payer: 59 | Source: Ambulatory Visit | Attending: Obstetrics and Gynecology | Admitting: Obstetrics and Gynecology

## 2017-01-09 ENCOUNTER — Other Ambulatory Visit (HOSPITAL_COMMUNITY)
Admission: RE | Admit: 2017-01-09 | Discharge: 2017-01-09 | Disposition: A | Discharge status: Home / Self Care | Payer: 59 | Source: Ambulatory Visit | Attending: Radiology | Admitting: Radiology

## 2017-01-09 DIAGNOSIS — E041 Nontoxic single thyroid nodule: Secondary | ICD-10-CM | POA: Insufficient documentation

## 2017-01-09 DIAGNOSIS — E042 Nontoxic multinodular goiter: Secondary | ICD-10-CM | POA: Diagnosis not present

## 2017-01-11 ENCOUNTER — Ambulatory Visit (INDEPENDENT_AMBULATORY_CARE_PROVIDER_SITE_OTHER): Payer: 59 | Admitting: Internal Medicine

## 2017-01-11 ENCOUNTER — Encounter: Payer: Self-pay | Admitting: Internal Medicine

## 2017-01-11 VITALS — BP 112/68 | HR 97 | Temp 98.2°F | Ht 64.0 in | Wt 160.6 lb

## 2017-01-11 DIAGNOSIS — E042 Nontoxic multinodular goiter: Secondary | ICD-10-CM

## 2017-01-11 NOTE — Patient Instructions (Signed)
Please send me a message in 6 months to order the new biopsy.  Please return in 1 year.   Thyroid Nodule A thyroid nodule is an isolatedgrowth of thyroid cells that forms a lump in your thyroid gland. The thyroid gland is a butterfly-shaped gland. It is found in the lower front of your neck. This gland sends chemical messengers (hormones) through your blood to all parts of your body. These hormones are important in regulating your body temperature and helping your body to use energy. Thyroid nodules are common. Most are not cancerous (are benign). You may have one nodule or several nodules. Different types of thyroid nodules include:  Nodules that grow and fill with fluid (thyroid cysts).  Nodules that produce too much thyroid hormone (hot nodules or hyperthyroid).  Nodules that produce no thyroid hormone (cold nodules or hypothyroid).  Nodules that form from cancer cells (thyroid cancers). What are the causes? Usually, the cause of this condition is not known. What increases the risk? Factors that make this condition more likely to develop include:  Increasing age. Thyroid nodules become more common in people who are older than 58 years of age.  Gender.  Benign thyroid nodules are more common in women.  Cancerous (malignant) thyroid nodules are more common in men.  A family history that includes:  Thyroid nodules.  Pheochromocytoma.  Thyroid carcinoma.  Hyperparathyroidism.  Certain kinds of thyroid diseases, such as Hashimoto thyroiditis.  Lack of iodine.  A history of head and neck radiation, such as from X-rays. What are the signs or symptoms? It is common for this condition to cause no symptoms. If you have symptoms, they may include:  A lump in your lower neck.  Feeling a lump or tickle in your throat.  Pain in your neck, jaw, or ear.  Having trouble swallowing. Hot nodules may cause symptoms that include:  Weight loss.  Warm, flushed skin.  Feeling  hot.  Feeling nervous.  A racing heartbeat. Cold nodules may cause symptoms that include:  Weight gain.  Dry skin.  Brittle hair. This may also occur with hair loss.  Feeling cold.  Fatigue. Thyroid cancer nodules may cause symptoms that include:  Hard nodules that feel stuck to the thyroid gland.  Hoarseness.  Lumps in the glands near your thyroid (lymph nodes). How is this diagnosed? A thyroid nodule may be felt by your health care provider during a physical exam. This condition may also be diagnosed based on your symptoms. You may also have tests, including:  An ultrasound. This may be done to confirm the diagnosis.  A biopsy. This involves taking a sample from the nodule and looking at it under a microscope to see if the nodule is benign.  Blood tests to make sure that your thyroid is working properly.  Imaging tests such as MRI or CT scan may be done if:  Your nodule is large.  Your nodule is blocking your airway.  Cancer is suspected. How is this treated? Treatment depends on the cause and size of your nodule or nodules. If the nodule is benign, treatment may not be necessary. Your health care provider may monitor the nodule to see if it goes away without treatment. If the nodule continues to grow, is cancerous, or does not go away:  It may need to be drained with a needle.  It may need to be removed with surgery. If you have surgery, part or all of your thyroid gland may need to be removed as well.  Follow these instructions at home:  Pay attention to any changes in your nodule.  Take over-the-counter and prescription medicines only as told by your health care provider.  Keep all follow-up visits as told by your health care provider. This is important. Contact a health care provider if:  Your voice changes.  You have trouble swallowing.  You have pain in your neck, ear, or jaw that is getting worse.  Your nodule gets bigger.  Your nodule starts to  make it harder for you to breathe. Get help right away if:  You have a sudden fever.  You feel very weak.  Your muscles look like they are shrinking (muscle wasting).  You have mood swings.  You feel very restless.  You feel confused.  You are seeing or hearing things that other people do not see or hear (having hallucinations).  You feel suddenly nauseous or throw up.  You suddenly have diarrhea.  You have chest pain.  There is a loss of consciousness. This information is not intended to replace advice given to you by your health care provider. Make sure you discuss any questions you have with your health care provider. Document Released: 08/04/2004 Document Revised: 05/14/2016 Document Reviewed: 12/23/2014 Elsevier Interactive Patient Education  2017 [employer].

## 2017-01-11 NOTE — Progress Notes (Signed)
Patient ID: Tracy May, female   DOB: 19-Sep-1956, 61 y.o.   MRN: [id number]    HPI  Tracy May is a 61 y.o.-year-old female, referred by her PCP, Dr. Posey Rea, for evaluation for thyroid nodules.  Patient describes that she developed a head tremor >> referred to neurology >> new dx of cervical dystonia >> will start Botox injections.   Part of the investigation for her neck dystonia >> cervical MRI >> showed a possible thyroid nodule. She then had a thyroid ultrasound that showed bilateral thyroid nodules.  Thyroid U/S (01/01/2017): 1) - 1.5 cm poorly defined right thyroid nodule meets criteria for biopsy. 2) - 2.6 cm left thyroid nodule meets criteria for biopsy.  The 2 nodules were Bx'ed (01/09/2017): 1) - scant epithelial sample >> inconclusive result 2) - benign follicular nodule  Pt denies: - feeling nodules in neck - hoarseness - dysphagia - choking - SOB with lying down  I reviewed pt's thyroid tests: Lab Results  Component Value Date   TSH 1.19 11/10/2016   TSH 1.42 09/17/2015   TSH 0.70 08/26/2014   TSH 0.47 10/16/2012   TSH 0.66 09/15/2011   TSH 0.50 09/09/2010   TSH 0.92 12/13/2009   TSH 0.68 11/27/2008   TSH 0.55 02/21/2008   TSH 0.99 07/22/2007    Pt denies: - fatigue - heat intolerance/cold intolerance - palpitations - anxiety/depression - hyperdefecation/constipation - weight loss/weight gain - dry skin  She does c/o: - tremors (see above) - hair loss  + FH of thyroid ds  -maternal aunt -? thyroid cancer. No h/o radiation tx to head or neck.  No seaweed or kelp. No recent contrast studies. No steroid use. No herbal supplements. + Biotin supplement - not taken the am of the latest TFTs.   She has a h/o BPPV, HL.  ROS: Constitutional: + see HPI, + poor sleep (on Ambien generic) Eyes: no blurry vision, no xerophthalmia ENT: + OCC. sore throat, + see HPI Cardiovascular: no CP/SOB/palpitations/leg swelling Respiratory: no cough/SOB/+  wheezing Gastrointestinal: no N/V/D/C Musculoskeletal: + both muscle/joint aches Skin: no rashes, + hair loss Neurological: + tremors/no numbness/tingling/+ dizziness (vertigo), + occ. HA Psychiatric: no depression/anxiety + low libido  Past Medical History:  Diagnosis Date  . Acne   . Allergic rhinitis   . GERD (gastroesophageal reflux disease)   . Vertigo    Past Surgical History:  Procedure Laterality Date  . ABDOMINAL HYSTERECTOMY    . BUNIONECTOMY Right   . ECTOPIC PREGNANCY SURGERY    . MYOMECTOMY    . WISDOM TOOTH EXTRACTION     Social History   Social History  . Marital status: Married    Spouse name: N/A  . Number of children: 1   Occupational History  . 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 Smoking status: Former Smoker    Quit date: 11/13/1996  . Smokeless tobacco: Never Used  . Alcohol use Yes     Comment:1-4  glass of wine a month  . Drug use: No   Social History Narrative   Regular Exercise- yes   Current Outpatient Prescriptions on File Prior to Visit  Medication Sig Dispense Refill  . acetaminophen (APAP EXTRA STRENGTH) 500 MG tablet Take 500 mg by mouth every 6 (six) hours as needed.    . Azelaic Acid (FINACEA) 15 % cream Apply topically 2 (two) times daily. After skin is thoroughly washed and patted dry, gently but thoroughly massage  a thin film of azelaic acid cream into the affected area twice daily, in the morning and evening.     . Biotin (BIOTIN MAXIMUM STRENGTH) 10 MG TABS Take 1 tablet by mouth daily.    . cetirizine (ZYRTEC) 10 MG tablet Take 10 mg by mouth daily.      . Cholecalciferol 2000 UNITS TABS Take 1 tablet by mouth daily.    Marland Kitchen estradiol (MINIVELLE) 0.05 MG/24HR patch Place 1 patch onto the skin 2 (two) times a week.    . Magnesium 250 MG TABS Take by mouth daily.    . meclizine (ANTIVERT) 12.5 MG tablet Take 12.5 mg by mouth as needed for dizziness.    Marland Kitchen zolpidem (AMBIEN) 10 MG tablet Take 1 tablet (10  mg total) by mouth at bedtime as needed. for sleep 30 tablet 3  . albuterol (PROAIR HFA) 108 (90 Base) MCG/ACT inhaler Inhale 2 puffs into the lungs every 6 (six) hours as needed for wheezing or shortness of breath. (Patient not taking: Reported on 01/11/2017) 1 Inhaler 6   No current facility-administered medications on file prior to visit.    Allergies  Allergen Reactions  . Azithromycin     REACTION: nausea and stomach pains  . Sulfonamide Derivatives    Family History  Problem Relation Age of Onset  . Cancer Mother     neck  . Cancer Sister     glioblastoma  . Diabetes Father   . Lung cancer Father    PE: BP 112/68 (BP Location: Left Arm, Patient Position: Sitting, Cuff Size: Normal)   Pulse 97   Temp 98.2 F (36.8 C) (Oral)   Ht  (1.626 m)   Wt 160 lb 9.6 oz (72.8 kg)   SpO2 98%   BMI 27.57 kg/m  Wt Readings from Last 3 Encounters:  01/11/17 160 lb 9.6 oz (72.8 kg)  11/13/16 161 lb (73 kg)  11/07/16 160 lb 12 oz (72.9 kg)   Constitutional: overweight, in NAD Eyes: PERRLA, EOMI, no exophthalmos ENT: moist mucous membranes, no thyromegaly, no cervical lymphadenopathy Cardiovascular: RRR, No MRG Respiratory: CTA B Gastrointestinal: abdomen soft, NT, ND, BS+ Musculoskeletal: no deformities, strength intact in all 4;  Skin: moist, warm, no rashes Neurological: no tremor with outstretched hands, DTR normal in all 4  ASSESSMENT: 1. Multiple thyroid nodules  PLAN: 1. Multiple thyroid nodules - I reviewed the images of her thyroid ultrasound along with the patient. I pointed out that:  1) the R nodule is not large, however, it is solid, he does have internal blood flow, and its not very well delimited from the surrounding tissues. This may be a pseudonodule (inflammatory nodule), but it is difficult to tell. This nodule was biopsied and the sample was inconclusive due to scant material. We discussed that this is usually an indication to repeat the biopsy and I  would suggest to do this 6 months from the previous. Patient agrees with the plan.  2)  the L nodule appears to be a composite of 2 nodules, both solid, and one with apparent macrocalcifications. Both have internal blood flow and they are not very well defined from the surrounding tissues. However, this composite nodule was biopsied and the results was benign. I would not recommend rebiopsy the nodule unless it increases in size or changes ultrasound in size characteristics in the future.  Pt does not have a thyroid cancer family history or a personal history of RxTx to head/neck. All these would favor benignity.   -  patients agrees to rebiopsy the right 1.5 cm nodule in 6 months from now. I advised her to send me a message through my chart around 05/2017 so I can reorder the test. - I explained that this is not cancer, we can continue to follow her on a yearly basis, and check another ultrasound in another year or 2. - she should let me know if she develops neck compression symptoms, in that case, we might need to do either lobectomy or thyroidectomy - I'll see her back in a year, assuming her FNA is normal. If FNA abnormal, we will meet sooner.   CC: Lynden Ang NP  Carlus Pavlov, MD PhD [facility]

## 2017-01-26 ENCOUNTER — Ambulatory Visit (INDEPENDENT_AMBULATORY_CARE_PROVIDER_SITE_OTHER): Payer: 59 | Admitting: Neurology

## 2017-01-26 DIAGNOSIS — G243 Spasmodic torticollis: Secondary | ICD-10-CM

## 2017-01-26 MED ORDER — ONABOTULINUMTOXINA 100 UNITS IJ SOLR
140.0000 [IU] | Freq: Once | INTRAMUSCULAR | Status: AC
  Administered 2017-01-26: 140 [IU] via INTRAMUSCULAR

## 2017-01-26 NOTE — Procedures (Signed)
[facility]   Procedure Note Botox  Attending: Dr. Lurena Joinerebecca Timera Windt  Preoperative Diagnosis(es): Cervical Dystonia  Result History  Onset of effect: n/a  Duration of Benefit: n/a Adverse Effects: n/a   Consent obtained from: The patient Benefits discussed included, but were not limited to decreased muscle tightness, increased joint range of motion, and decreased pain.  Risk discussed included, but were not limited pain and discomfort, bleeding, bruising, excessive weakness, venous thrombosis, muscle atrophy and dysphagia.  A copy of the patient medication guide was given to the patient which explains the blackbox warning.  Patients identity and treatment sites confirmed Yes.  .  Details of Procedure: Skin was cleaned with alcohol.   Prior to injection, the needle plunger was aspirated to make sure the needle was not within a blood vessel.  There was no blood retrieved on aspiration.    Following is a summary of the muscles injected  And the amount of Botulinum toxin used:   Dilution 0.9% preservative free saline mixed with 100 u Botox type A to make 10 U per 0.1cc  Injections  Location Left  Right Units Number of sites        Sternocleidomastoid  40 40 1  Splenius Capitus, posterior approach 70  70 1  Splenius Capitus, lateral approach 30  30 1   Levator Scapulae      Trapezius            TOTAL UNITS:   140    Agent: Botulinum Type A ( Onobotulinum Toxin type A ).  2 vials of Botox were used, each containing 100 units and freshly diluted with 1 mL of sterile, non-preserved saline   Total injected (Units): 140  Total wasted (Units): none wasted   Pt tolerated procedure well without complications.   Reinjection is anticipated in 3 months.

## 2017-01-31 ENCOUNTER — Other Ambulatory Visit: Payer: Self-pay | Admitting: *Deleted

## 2017-01-31 MED ORDER — MECLIZINE HCL 12.5 MG PO TABS: 12.5000 mg | ORAL_TABLET | ORAL | 0 refills | Status: DC | PRN

## 2017-02-01 ENCOUNTER — Ambulatory Visit: Payer: 59 | Admitting: Neurology

## 2017-02-01 MED ORDER — MECLIZINE HCL 12.5 MG PO TABS: 12.5000 mg | ORAL_TABLET | Freq: Every day | ORAL | 0 refills | Status: DC | PRN

## 2017-02-01 NOTE — Telephone Encounter (Signed)
Rec'd fax stating plan requires specific direction to process rx. Recent rx once a day as needed...Raechel Chute/lmb

## 2017-02-01 NOTE — Addendum Note (Signed)
Addended by: Deatra JamesBRAND, Orena Cavazos M on: 02/01/2017 09:25 AM   Modules accepted: Orders

## 2017-02-22 NOTE — Progress Notes (Signed)
Subjective:   Tracy May was seen in consultation in the movement disorder clinic at the request of Plotnikov, Georgina Quint, MD.  The evaluation is for tremor.  Tremor started approximately 6 months ago and involves the head.  She first noted that head was pulling to the left.  She notices it in the "no" direction.  She does have chronic neck pain.  She notes that only trying to be still will make it worse.  Anger/emotion doesn't seem to change it.  There is no family hx of tremor.    Affected by caffeine:  unknown (4 cups per day)  Current/Previously tried tremor medications: no  Current medications that may exacerbate tremor:  N/a  03/06/17 update: Pt f/u today.  She had botox on 01/26/17 for cervical dystonia.  States that it helped but she still has some symptoms.  She also noted that insurance denied the claim.  She feels the "tilt" of the head but not the "turn" and is having pain over the right trapezius.  Also c/o RLS.    Outside reports reviewed: historical medical records, lab reports, office notes and referral letter/letters.  MRI brain done in 07/2006 was normal.  Allergies  Allergen Reactions  . Azithromycin     REACTION: nausea and stomach pains  . Sulfonamide Derivatives     Outpatient Encounter Prescriptions as of 03/06/2017  Medication Sig  . acetaminophen (APAP EXTRA STRENGTH) 500 MG tablet Take 500 mg by mouth every 6 (six) hours as needed.  Marland Kitchen albuterol (PROAIR HFA) 108 (90 Base) MCG/ACT inhaler Inhale 2 puffs into the lungs every 6 (six) hours as needed for wheezing or shortness of breath.  . Azelaic Acid (FINACEA) 15 % cream Apply topically 2 (two) times daily. After skin is thoroughly washed and patted dry, gently but thoroughly massage a thin film of azelaic acid cream into the affected area twice daily, in the morning and evening.   . Biotin (BIOTIN MAXIMUM STRENGTH) 10 MG TABS Take 1 tablet by mouth daily.  . cetirizine (ZYRTEC) 10 MG tablet Take 10 mg by mouth  daily.    . Cholecalciferol 2000 UNITS TABS Take 1 tablet by mouth daily.  Marland Kitchen estradiol (MINIVELLE) 0.05 MG/24HR patch Place 1 patch onto the skin 2 (two) times a week.  . Magnesium 250 MG TABS Take by mouth daily.  . meclizine (ANTIVERT) 12.5 MG tablet Take 1 tablet (12.5 mg total) by mouth daily as needed for dizziness.  Marland Kitchen zolpidem (AMBIEN) 10 MG tablet Take 1 tablet (10 mg total) by mouth at bedtime as needed. for sleep   No facility-administered encounter medications on file as of 03/06/2017.     Past Medical History:  Diagnosis Date  . Acne   . Allergic rhinitis   . GERD (gastroesophageal reflux disease)   . Vertigo     Past Surgical History:  Procedure Laterality Date  . ABDOMINAL HYSTERECTOMY    . BUNIONECTOMY Right   . ECTOPIC PREGNANCY SURGERY    . MYOMECTOMY    . WISDOM TOOTH EXTRACTION      Social History   Social History  . Marital status: Married    Spouse name: N/A  . Number of children: N/A  . Years of education: N/A   Occupational History  . CMA     clinic manager   Social History Main Topics  . Smoking status: Former Smoker    Quit date: 11/13/1996  . Smokeless tobacco: Never Used  . Alcohol use Yes  Comment: glass of wine a month  . Drug use: No  . Sexual activity: Not on file   Other Topics Concern  . Not on file   Social History Narrative   Regular Exercise- yes    Family Status  Relation Status  . Mother Deceased  . Sister Deceased  . Father Deceased  . Sister Alive  . Brother Alive  . Daughter Alive    Review of Systems A complete 10 system ROS was obtained and was negative apart from what is mentioned.   Objective:   VITALS:   Vitals:   03/06/17 0823  BP: 100/60  Pulse: 65  SpO2: 96%  Weight: 160 lb (72.6 kg)  Height: 5\' 4"  (1.626 m)   Gen:  Appears stated age and in NAD. HEENT:  Normocephalic, atraumatic. The mucous membranes are moist. The superficial temporal arteries are without ropiness or tenderness.  The  head is turned to the L but much better than previously.  There is no longer head titubation.  R ear minimally approximates the right shoulder.  She exhibits much less geste antagoniste since last visit.   Cardiovascular: Regular rate and rhythm. Lungs: Clear to auscultation bilaterally. Neck: There are no carotid bruits noted bilaterally.  NEUROLOGICAL:  Orientation:  The patient is alert and oriented x 3.  Recent and remote memory are intact.  Attention span and concentration are normal.  Able to name objects and repeat without trouble.  Fund of knowledge is appropriate Cranial nerves: There is good facial symmetry. The pupils are equal round and reactive to light bilaterally. Fundoscopic exam reveals clear disc margins bilaterally. Extraocular muscles are intact and visual fields are full to confrontational testing. Speech is fluent and clear. Soft palate rises symmetrically and there is no tongue deviation. Hearing is intact to conversational tone. Tone: Tone is good throughout. Sensation: Sensation is intact to light touch throughout Coordination:  The patient has no dysdiadichokinesia or dysmetria. Motor: Strength is 5/5 in the bilateral upper and lower extremities.  Shoulder shrug is equal bilaterally.  There is no pronator drift.  There are no fasciculations noted. Gait and Station: The patient is able to ambulate without difficulty. The patient is able to heel toe walk without any difficulty. The patient is able to ambulate in a tandem fashion. The patient is able to stand in the Romberg position.   LABS:  Lab Results  Component Value Date   TSH 1.19 11/10/2016   Lab Results  Component Value Date   WBC 5.4 11/10/2016   HGB 13.8 11/10/2016   HCT 40.5 11/10/2016   MCV 92.9 11/10/2016   PLT 337.0 11/10/2016        Assessment/Plan:   1.  Cervical Dystonia  -had first series of botox on 01/26/17.  Was helpful.  May slightly increase to R SCM/L splenius and add the R  trapezius  -talked about using klonopin in addition to botox.  She wants to think about this  -talked about myofascial PT post next botox  -we will check on her claim to make sure that she can continue with her botox  2.  RLS  -talked about klonopin and pramipexole and r/b/se of both.  Wants to hold for now.    3.  Will see her at botox.  Much greater than 50% of this visit was spent in counseling and coordinating care.  Total face to face time:  25 min    CC:  Plotnikov, Georgina QuintAleksei V, MD

## 2017-03-06 ENCOUNTER — Encounter: Payer: Self-pay | Admitting: Neurology

## 2017-03-06 ENCOUNTER — Ambulatory Visit (INDEPENDENT_AMBULATORY_CARE_PROVIDER_SITE_OTHER): Payer: 59 | Admitting: Neurology

## 2017-03-06 VITALS — BP 100/60 | HR 65 | Ht 64.0 in | Wt 160.0 lb

## 2017-03-06 DIAGNOSIS — G243 Spasmodic torticollis: Secondary | ICD-10-CM | POA: Diagnosis not present

## 2017-03-06 DIAGNOSIS — G2581 Restless legs syndrome: Secondary | ICD-10-CM

## 2017-03-08 ENCOUNTER — Ambulatory Visit: Payer: 59 | Admitting: Neurology

## 2017-03-12 ENCOUNTER — Encounter: Payer: Self-pay | Admitting: Neurology

## 2017-03-13 ENCOUNTER — Telehealth: Payer: Self-pay | Admitting: Neurology

## 2017-03-13 MED ORDER — MELOXICAM 15 MG PO TABS: 15.0000 mg | ORAL_TABLET | Freq: Every day | ORAL | 2 refills | Status: DC | PRN

## 2017-03-13 NOTE — Telephone Encounter (Signed)
Tracy RubensteinJade, tell her we can give this (and I looked at her renal fxn from Feb which was okay) but don't use daily as can have affect kidneys/stomach/GI bleeds, etc. Can use prn though. Would write RX for 1 po q day prn #20, RF 2.   ABOVE NOTE received from Dr. Arbutus Leasat. RX sent to pharmacy. Mychart message sent to patient making her aware.

## 2017-03-19 MED ORDER — MELOXICAM 15 MG PO TABS: 15.0000 mg | ORAL_TABLET | Freq: Every day | ORAL | 2 refills | Status: DC | PRN

## 2017-03-19 NOTE — Addendum Note (Signed)
Addended bySilvio Pate: Leigha Olberding L on: 03/19/2017 10:34 AM   Modules accepted: Orders

## 2017-04-25 ENCOUNTER — Encounter: Payer: Self-pay | Admitting: Neurology

## 2017-04-27 ENCOUNTER — Telehealth: Payer: Self-pay | Admitting: Neurology

## 2017-04-27 ENCOUNTER — Ambulatory Visit: Payer: 59 | Admitting: Nurse Practitioner

## 2017-04-27 NOTE — Telephone Encounter (Signed)
[facility]. [id number] Good through 04/25/17 through 04/25/18. Please call. Thanks

## 2017-05-04 ENCOUNTER — Encounter: Payer: Self-pay | Admitting: Neurology

## 2017-05-04 ENCOUNTER — Ambulatory Visit: Payer: 59 | Admitting: Neurology

## 2017-05-08 ENCOUNTER — Encounter: Payer: Self-pay | Admitting: Neurology

## 2017-05-18 ENCOUNTER — Ambulatory Visit (INDEPENDENT_AMBULATORY_CARE_PROVIDER_SITE_OTHER): Payer: 59 | Admitting: Neurology

## 2017-05-18 DIAGNOSIS — G243 Spasmodic torticollis: Secondary | ICD-10-CM

## 2017-05-18 MED ORDER — ONABOTULINUMTOXINA 100 UNITS IJ SOLR
240.0000 [IU] | Freq: Once | INTRAMUSCULAR | Status: AC
  Administered 2017-05-18: 240 [IU] via INTRAMUSCULAR

## 2017-05-18 NOTE — Procedures (Signed)
[facility]   Procedure Note Botox  Attending: Dr. Lurena Joiner Millena Callins  Preoperative Diagnosis(es): Cervical Dystonia  Result History  Onset of effect: n/a  Duration of Benefit: n/a Adverse Effects: n/a   Consent obtained from: The patient Benefits discussed included, but were not limited to decreased muscle tightness, increased joint range of motion, and decreased pain.  Risk discussed included, but were not limited pain and discomfort, bleeding, bruising, excessive weakness, venous thrombosis, muscle atrophy and dysphagia.  A copy of the patient medication guide was given to the patient which explains the blackbox warning.  Patients identity and treatment sites confirmed Yes.  .  Details of Procedure: Skin was cleaned with alcohol.   Prior to injection, the needle plunger was aspirated to make sure the needle was not within a blood vessel.  There was no blood retrieved on aspiration.    Following is a summary of the muscles injected  And the amount of Botulinum toxin used:   Dilution 0.9% preservative free saline mixed with 100 u Botox type A to make 10 U per 0.1cc  Injections  Location Left  Right Units Number of sites        Sternocleidomastoid  60 40 1  Splenius Capitus, posterior approach 100  100 1  Splenius Capitus, lateral approach 40  40 1  Levator Scapulae      Trapezius  20/20 40 2        TOTAL UNITS:   240    Agent: Botulinum Type A ( Onobotulinum Toxin type A ).  2 vials of Botox were used, each containing 100 units and freshly diluted with 1 mL of sterile, non-preserved saline   Total injected (Units): 240  Total wasted (Units): 15   Pt tolerated procedure well without complications.   Reinjection is anticipated in 3 months.

## 2017-05-22 ENCOUNTER — Other Ambulatory Visit: Payer: Self-pay | Admitting: Neurology

## 2017-05-30 DIAGNOSIS — Z01419 Encounter for gynecological examination (general) (routine) without abnormal findings: Secondary | ICD-10-CM | POA: Diagnosis not present

## 2017-06-04 ENCOUNTER — Telehealth: Payer: Self-pay | Admitting: *Deleted

## 2017-06-04 ENCOUNTER — Encounter: Payer: Self-pay | Admitting: Neurology

## 2017-06-04 NOTE — Telephone Encounter (Signed)
Would you like for me to order myofascial release with Gaspar BiddingMichael Rigby, DO?

## 2017-06-04 NOTE — Telephone Encounter (Signed)
No, just PT via neurorehab for cervical dystonia but if neurorehab isn't convenient for her (she works and her schedule is crazy), breakthrough might be a better option

## 2017-06-05 NOTE — Telephone Encounter (Signed)
Will send referral

## 2017-06-20 DIAGNOSIS — L700 Acne vulgaris: Secondary | ICD-10-CM | POA: Diagnosis not present

## 2017-06-20 DIAGNOSIS — Z23 Encounter for immunization: Secondary | ICD-10-CM | POA: Diagnosis not present

## 2017-06-20 DIAGNOSIS — L680 Hirsutism: Secondary | ICD-10-CM | POA: Diagnosis not present

## 2017-06-25 ENCOUNTER — Encounter: Payer: Self-pay | Admitting: Internal Medicine

## 2017-06-26 ENCOUNTER — Other Ambulatory Visit: Payer: Self-pay | Admitting: Internal Medicine

## 2017-06-26 DIAGNOSIS — E042 Nontoxic multinodular goiter: Secondary | ICD-10-CM

## 2017-07-10 ENCOUNTER — Other Ambulatory Visit (HOSPITAL_COMMUNITY)
Admission: RE | Admit: 2017-07-10 | Discharge: 2017-07-10 | Disposition: A | Discharge status: Home / Self Care | Payer: 59 | Source: Ambulatory Visit | Attending: Student | Admitting: Student

## 2017-07-10 ENCOUNTER — Ambulatory Visit
Admission: RE | Admit: 2017-07-10 | Discharge: 2017-07-10 | Disposition: A | Discharge status: Home / Self Care | Payer: 59 | Source: Ambulatory Visit | Attending: Internal Medicine | Admitting: Internal Medicine

## 2017-07-10 DIAGNOSIS — E042 Nontoxic multinodular goiter: Secondary | ICD-10-CM | POA: Diagnosis present

## 2017-07-10 DIAGNOSIS — E041 Nontoxic single thyroid nodule: Secondary | ICD-10-CM | POA: Diagnosis not present

## 2017-08-03 ENCOUNTER — Ambulatory Visit (INDEPENDENT_AMBULATORY_CARE_PROVIDER_SITE_OTHER): Payer: 59 | Admitting: Neurology

## 2017-08-03 DIAGNOSIS — G243 Spasmodic torticollis: Secondary | ICD-10-CM | POA: Diagnosis not present

## 2017-08-03 MED ORDER — ONABOTULINUMTOXINA 100 UNITS IJ SOLR
240.0000 [IU] | Freq: Once | INTRAMUSCULAR | Status: AC
  Administered 2017-08-03: 240 [IU] via INTRAMUSCULAR

## 2017-08-03 NOTE — Procedures (Signed)
[facility]   Procedure Note Botox  Attending: Dr. Lurena Joinerebecca Tat  Preoperative Diagnosis(es): Cervical Dystonia  Result History  Pt noting much improvement in head tremor and neck pain is now gone.    Consent obtained from: The patient Benefits discussed included, but were not limited to decreased muscle tightness, increased joint range of motion, and decreased pain.  Risk discussed included, but were not limited pain and discomfort, bleeding, bruising, excessive weakness, venous thrombosis, muscle atrophy and dysphagia.  A copy of the patient medication guide was given to the patient which explains the blackbox warning.  Patients identity and treatment sites confirmed Yes.  .  Details of Procedure: Skin was cleaned with alcohol.   Prior to injection, the needle plunger was aspirated to make sure the needle was not within a blood vessel.  There was no blood retrieved on aspiration.    Following is a summary of the muscles injected  And the amount of Botulinum toxin used:   Dilution 0.9% preservative free saline mixed with 100 u Botox type A to make 10 U per 0.1cc  Injections  Location Left  Right Units Number of sites        Sternocleidomastoid  60 40 1  Splenius Capitus, posterior approach 100  100 1  Splenius Capitus, lateral approach 40  40 1  Levator Scapulae      Trapezius  20/20 40 2        TOTAL UNITS:   240    Agent: Botulinum Type A ( Onobotulinum Toxin type A ).  2 vials of Botox were used, each containing 100 units and freshly diluted with 1 mL of sterile, non-preserved saline   Total injected (Units): 240  Total wasted (Units): 0   Pt tolerated procedure well without complications.   Reinjection is anticipated in 3 months.

## 2017-08-27 ENCOUNTER — Ambulatory Visit: Payer: 59 | Admitting: Internal Medicine

## 2017-08-27 ENCOUNTER — Encounter: Payer: Self-pay | Admitting: Internal Medicine

## 2017-08-27 DIAGNOSIS — G500 Trigeminal neuralgia: Secondary | ICD-10-CM | POA: Diagnosis not present

## 2017-08-27 DIAGNOSIS — R51 Headache: Secondary | ICD-10-CM | POA: Diagnosis not present

## 2017-08-27 DIAGNOSIS — R519 Headache, unspecified: Secondary | ICD-10-CM

## 2017-08-27 MED ORDER — VALACYCLOVIR HCL 1 G PO TABS: 1000.0000 mg | ORAL_TABLET | Freq: Three times a day (TID) | ORAL | 0 refills | Status: DC

## 2017-08-27 MED ORDER — B COMPLEX PO TABS: 1.0000 | ORAL_TABLET | Freq: Every day | ORAL | 3 refills | Status: AC

## 2017-08-27 NOTE — Assessment & Plan Note (Addendum)
R ear area pain 2018 B complex qd Meloxicam prn Vatrex empiric x7 d Sister died of glioblastoma at 6942 - sch MRI

## 2017-08-27 NOTE — Progress Notes (Signed)
Subjective:  Patient ID: Tracy May, female    DOB: 05/06/1956  Age: [age] y.o. MRN: [id number]  CC: No chief complaint on file.   HPI Tracy May presents for R ear pain x 6 month - on and off  Outpatient Medications Prior to Visit  Medication Sig Dispense Refill  . acetaminophen (APAP EXTRA STRENGTH) 500 MG tablet Take 500 mg by mouth every 6 (six) hours as needed.    . Azelaic Acid (FINACEA) 15 % cream Apply topically 2 (two) times daily. After skin is thoroughly washed and patted dry, gently but thoroughly massage a thin film of azelaic acid cream into the affected area twice daily, in the morning and evening.     . Biotin (BIOTIN MAXIMUM STRENGTH) 10 MG TABS Take 1 tablet by mouth daily.    . cetirizine (ZYRTEC) 10 MG tablet Take 10 mg by mouth daily.      . Cholecalciferol 2000 UNITS TABS Take 1 tablet by mouth daily.    Marland Kitchen. estradiol (MINIVELLE) 0.05 MG/24HR patch Place 1 patch onto the skin 2 (two) times a week.    . Magnesium 250 MG TABS Take by mouth daily.    . meclizine (ANTIVERT) 12.5 MG tablet Take 1 tablet (12.5 mg total) by mouth daily as needed for dizziness. 30 tablet 0  . meloxicam (MOBIC) 15 MG tablet TAKE 1 TABLET(15 MG) BY MOUTH DAILY AS NEEDED FOR PAIN 90 tablet 1  . zolpidem (AMBIEN) 10 MG tablet Take 1 tablet (10 mg total) by mouth at bedtime as needed. for sleep 30 tablet 3   No facility-administered medications prior to visit.     ROS Review of Systems  Objective:  BP 120/82 (BP Location: Left Arm, Patient Position: Sitting, Cuff Size: Normal)   Pulse (!) 50   Temp 98 F (36.7 C) (Oral)   Ht 5\' 4"  (1.626 m)   Wt 140 lb (63.5 kg)   SpO2 98%   BMI 24.03 kg/m   BP Readings from Last 3 Encounters:  08/27/17 120/82  03/06/17 100/60  01/11/17 112/68    Wt Readings from Last 3 Encounters:  08/27/17 140 lb (63.5 kg)  03/06/17 160 lb (72.6 kg)  01/11/17 160 lb 9.6 oz (72.8 kg)    Physical Exam  Lab Results  Component Value Date   WBC 5.4  11/10/2016   HGB 13.8 11/10/2016   HCT 40.5 11/10/2016   PLT 337.0 11/10/2016   GLUCOSE 96 11/10/2016   CHOL 219 (H) 11/10/2016   TRIG 40.0 11/10/2016   HDL 76.60 11/10/2016   LDLDIRECT 102.3 10/16/2012   LDLCALC 135 (H) 11/10/2016   ALT 15 11/10/2016   AST 12 11/10/2016   NA 139 11/10/2016   K 4.1 11/10/2016   CL 105 11/10/2016   CREATININE 0.82 11/10/2016   BUN 15 11/10/2016   CO2 27 11/10/2016   TSH 1.19 11/10/2016    [place] Thyroid Fna Each Nodule  Result Date: 07/11/2017 INDICATION: Indeterminate thyroid nodule of the right mid thyroid. Request is made for fine-needle aspiration of right mid thyroid nodule. EXAM: ULTRASOUND GUIDED FINE NEEDLE ASPIRATION OF INDETERMINATE THYROID NODULE COMPARISON:  ULTRASOUND THYROID 01/01/2017 MEDICATIONS: 2 mL 1% lidocaine COMPLICATIONS: None immediate. TECHNIQUE: Informed written consent was obtained from the patient after a discussion of the risks, benefits and alternatives to treatment. Questions regarding the procedure were encouraged and answered. A timeout was performed prior to the initiation of the procedure. Pre-procedural ultrasound scanning demonstrated unchanged size and appearance of the  indeterminate nodule within the right mid thyroid. The procedure was planned. The neck was prepped in the usual sterile fashion, and a sterile drape was applied covering the operative field. A timeout was performed prior to the initiation of the procedure. Local anesthesia was provided with 1% lidocaine. Under direct ultrasound guidance, 5 FNA biopsies were performed of the indeterminate thyroid nodule with a 25 gauge needle. Multiple ultrasound images were saved for procedural documentation purposes. The samples were prepared and submitted to pathology. Limited post procedural scanning was negative for hematoma or additional complication. Dressings were placed. The patient tolerated the above procedures procedure well without immediate postprocedural  complication. FINDINGS: Nodule reference number based on prior diagnostic ultrasound: 2 Maximum size: 1.5 cm Location: Right; Mid ACR TI-RADS risk category: TR4 (4-6 points) Reason for biopsy: meets ACR TI-RADS criteria Ultrasound imaging confirms appropriate placement of the needles within the thyroid nodule. IMPRESSION: Technically successful ultrasound guided fine needle aspiration of indeterminate right mid thyroid nodule. Electronically Signed   By: Judie PetitM.  Shick M.D.   On: 07/10/2017 17:12    Assessment & Plan:   There are no diagnoses linked to this encounter. I am having Tracy May maintain her Azelaic Acid, cetirizine, acetaminophen, Cholecalciferol, Biotin, estradiol, Magnesium, zolpidem, meclizine, and meloxicam.  No orders of the defined types were placed in this encounter.    Follow-up: No Follow-up on file.  Sonda PrimesAlex Adabella Stanis, MD

## 2017-08-27 NOTE — Patient Instructions (Signed)
Trigeminal Neuralgia Trigeminal neuralgia is a nerve disorder that causes attacks of severe facial pain. The attacks last from a few seconds to several minutes. They can happen for days, weeks, or months and then go away for months or years. Trigeminal neuralgia is also called tic douloureux. What are the causes? This condition is caused by damage to a nerve in the face that is called the trigeminal nerve. An attack can be triggered by:  Talking.  Chewing.  Putting on makeup.  Washing your face.  Shaving your face.  Brushing your teeth.  Touching your face.  What increases the risk? This condition is more likely to develop in:  Women.  People who are 50 years of age or older.  What are the signs or symptoms? The main symptom of this condition is pain in the jaw, lips, eyes, nose, scalp, forehead, and face. The pain may be intense, stabbing, electric, or shock-like. How is this diagnosed? This condition is diagnosed with a physical exam. A CT scan or MRI may be done to rule out other conditions that can cause facial pain. How is this treated? This condition may be treated with:  Avoiding the things that trigger your attacks.  Pain medicine.  Surgery. This may be done in severe cases if other medical treatment does not provide relief.  Follow these instructions at home:  Take over-the-counter and prescription medicines only as told by your health care provider.  If you wish to get pregnant, talk with your health care provider before you start trying to get pregnant.  Avoid the things that trigger your attacks. It may help to: ? Chew on the unaffected side of your mouth. ? Avoid touching your face. ? Avoid blasts of hot or cold air. Contact a health care provider if:  Your pain medicine is not helping.  You develop new, unexplained symptoms, such as: ? Double vision. ? Facial weakness. ? Changes in hearing or balance.  You become pregnant. Get help right away  if:  Your pain is unbearable, and your pain medicine does not help. This information is not intended to replace advice given to you by your health care provider. Make sure you discuss any questions you have with your health care provider. Document Released: 09/08/2000 Document Revised: 05/14/2016 Document Reviewed: 01/04/2015 Elsevier Interactive Patient Education  2018 Elsevier Inc.  

## 2017-09-29 ENCOUNTER — Ambulatory Visit
Admission: RE | Admit: 2017-09-29 | Discharge: 2017-09-29 | Disposition: A | Discharge status: Home / Self Care | Payer: 59 | Source: Ambulatory Visit | Attending: Internal Medicine | Admitting: Internal Medicine

## 2017-09-29 DIAGNOSIS — R51 Headache: Principal | ICD-10-CM

## 2017-09-29 DIAGNOSIS — R200 Anesthesia of skin: Secondary | ICD-10-CM | POA: Diagnosis not present

## 2017-09-29 DIAGNOSIS — G8929 Other chronic pain: Secondary | ICD-10-CM

## 2017-09-29 MED ORDER — GADOBENATE DIMEGLUMINE 529 MG/ML IV SOLN
12.0000 mL | Freq: Once | INTRAVENOUS | Status: AC | PRN
  Administered 2017-09-29: 12 mL via INTRAVENOUS

## 2017-09-30 ENCOUNTER — Encounter: Payer: Self-pay | Admitting: Internal Medicine

## 2017-11-02 ENCOUNTER — Ambulatory Visit (INDEPENDENT_AMBULATORY_CARE_PROVIDER_SITE_OTHER): Payer: 59 | Admitting: Neurology

## 2017-11-02 DIAGNOSIS — G243 Spasmodic torticollis: Secondary | ICD-10-CM | POA: Diagnosis not present

## 2017-11-02 MED ORDER — ONABOTULINUMTOXINA 100 UNITS IJ SOLR
240.0000 [IU] | Freq: Once | INTRAMUSCULAR | Status: AC
Start: 2017-11-02 — End: 2017-11-02
  Administered 2017-11-02: 240 [IU] via INTRAMUSCULAR

## 2017-11-02 NOTE — Procedures (Signed)
[facility]   Procedure Note Botox  Attending: Dr. Lurena Joinerebecca Britt Theard  Preoperative Diagnosis(es): Cervical Dystonia  Result History  Pt noting much improvement in head tremor and neck pain is now gone.    Consent obtained from: The patient Benefits discussed included, but were not limited to decreased muscle tightness, increased joint range of motion, and decreased pain.  Risk discussed included, but were not limited pain and discomfort, bleeding, bruising, excessive weakness, venous thrombosis, muscle atrophy and dysphagia.  A copy of the patient medication guide was given to the patient which explains the blackbox warning.  Patients identity and treatment sites confirmed Yes.  .  Details of Procedure: Skin was cleaned with alcohol.   Prior to injection, the needle plunger was aspirated to make sure the needle was not within a blood vessel.  There was no blood retrieved on aspiration.    Following is a summary of the muscles injected  And the amount of Botulinum toxin used:   Dilution 0.9% preservative free saline mixed with 100 u Botox type A to make 10 U per 0.1cc  Injections  Location Left  Right Units Number of sites        Sternocleidomastoid  60 60 1  Splenius Capitus, posterior approach 100  100 1  Splenius Capitus, lateral approach 40  40 1  Levator Scapulae      Trapezius  20/20 40 2        TOTAL UNITS:   240    Agent: Botulinum Type A ( Onobotulinum Toxin type A ).  2 vials of Botox were used, each containing 100 units and freshly diluted with 1 mL of sterile, non-preserved saline   Total injected (Units): 240  Total wasted (Units): 0   Pt tolerated procedure well without complications.   Reinjection is anticipated in 3 months.

## 2017-11-05 DIAGNOSIS — R300 Dysuria: Secondary | ICD-10-CM | POA: Diagnosis not present

## 2017-12-08 DIAGNOSIS — Z1231 Encounter for screening mammogram for malignant neoplasm of breast: Secondary | ICD-10-CM | POA: Diagnosis not present

## 2017-12-08 LAB — HM MAMMOGRAPHY

## 2017-12-21 ENCOUNTER — Telehealth: Payer: Self-pay | Admitting: Neurology

## 2017-12-21 NOTE — Telephone Encounter (Signed)
Patient left a message on the voicemail stating she needs to talk to someone about her Botox treatment. You may call her at [phone] or [phone]

## 2017-12-21 NOTE — Telephone Encounter (Signed)
Spoke with patient - she states she was trying to work with Botox savings program, but her claim has not been accepted for her 11/02/17 Botox appt.  She states she called [employer] and they had sent a request for more information but have not received it. She wanted to know who to contact about this? If [facility] requests more information for billing purposes who does this go to?   Eber JonesCarolyn - do you know the answer to this? She just needs to know who to contact.

## 2017-12-25 NOTE — Telephone Encounter (Signed)
Normally this comes to the office to be completed in most work flows. This is more about a saving program it sounds like per your message which has nothing to do with billing. The payer is looking for documentation information to verify that patient qualifies in most cases. This from my experience is completed by the clinical staff based on the documentation in chart. Again, I am not 100% sure with [facility] however the patient can call [facility] and speak to them and they should be able to give her the name and fax number information as to where this is being sent as well as what type of information they are looking for. I have called the patient and left a message for her to call back so I can try and assist her and determine what information [employer] is looking for.

## 2017-12-28 NOTE — Telephone Encounter (Signed)
Called spoke with patient in follow up to the outstanding charge for [phone]. She was able to complete a conference call [facility] and [facility] the additional information that they need to bill out the claim for reimbursement.

## 2017-12-28 NOTE — Telephone Encounter (Signed)
I called and spoke to the patient and her concern was that [facility] informed [facility] that her claim for DOS 11/02/17 has not been paid. This was the reason she is unable to get reimbursement from the savings plan. I reached out to Felix AhmadiAnthony Miller in our billing department and he was able to confirm that DOS 11/02/17 the 64616 was [facility] however the J0585 was not. He is researching now to verify why J0585 was not paid. I have called and left the patient a voicemail message to let her know my findings and also let her know that I will call her back once we are able to determine why the J0585 was not paid. I left her my name and number if she has any further questions in regards to this claim.

## 2018-01-11 ENCOUNTER — Ambulatory Visit: Payer: 59 | Admitting: Internal Medicine

## 2018-01-23 ENCOUNTER — Ambulatory Visit: Payer: 59 | Admitting: Internal Medicine

## 2018-01-23 ENCOUNTER — Encounter: Payer: Self-pay | Admitting: Internal Medicine

## 2018-01-23 VITALS — BP 108/68 | HR 58 | Ht 64.0 in | Wt 135.6 lb

## 2018-01-23 VITALS — BP 112/64 | HR 59 | Temp 98.8°F | Ht 64.0 in | Wt 136.0 lb

## 2018-01-23 DIAGNOSIS — E042 Nontoxic multinodular goiter: Secondary | ICD-10-CM | POA: Diagnosis not present

## 2018-01-23 DIAGNOSIS — R1013 Epigastric pain: Secondary | ICD-10-CM | POA: Diagnosis not present

## 2018-01-23 DIAGNOSIS — Z0000 Encounter for general adult medical examination without abnormal findings: Secondary | ICD-10-CM

## 2018-01-23 NOTE — Assessment & Plan Note (Signed)
RUQ/epig spastic spells x2 Abd [place] To ER if >30 min

## 2018-01-23 NOTE — Progress Notes (Addendum)
Patient ID: Tracy May, female   DOB: [birth year], 62 y.o.   MRN: [id number]    HPI  Tracy May is a 62 y.o.-year-old female, returning for follow-up for thyroid nodules.  Last visit 1 year ago.  Reviewed and addended history: Patient has a head tremor >> referred to neurology >> dx of cervical dystonia >> started Botox injections.  Part of the investigation for her neck dystonia >> cervical MRI, which showed a possible thyroid nodule.  A thyroid ultrasound obtained subsequently showed bilateral thyroid nodules.  Thyroid U/S (01/01/2017): 1) - 1.5 cm poorly defined right thyroid nodule meets criteria for biopsy. 2) - 2.6 cm left thyroid nodule meets criteria for biopsy.  The 2 nodules were Bx'ed (01/09/2017): 1) - scant epithelial sample >> inconclusive result 2) - benign follicular nodule  The inconclusive biopsy was repeated (07/10/2017): benign  Pt denies: - feeling nodules in neck - hoarseness - dysphagia - choking - SOB with lying down  TFTs have been normal: Lab Results  Component Value Date   TSH 1.19 11/10/2016   TSH 1.42 09/17/2015   TSH 0.70 08/26/2014   TSH 0.47 10/16/2012   TSH 0.66 09/15/2011   TSH 0.50 09/09/2010   TSH 0.92 12/13/2009   TSH 0.68 11/27/2008   TSH 0.55 02/21/2008   TSH 0.99 07/22/2007    + FH of thyroid ds  -maternal aunt -? thyroid cancer.No FH of thyroid cancer. No h/o radiation tx to head or neck.  No seaweed or kelp. No recent contrast studies. No herbal supplements. + Biotin use. No recent steroids use.   She has a h/o BPPV, HL.  ROS: Constitutional: + weight loss (intentional, Doylene Bode), no fatigue, + occasional hot flashes, no subjective hypothermia Eyes: no blurry vision, no xerophthalmia ENT: no sore throat, + see HPI Cardiovascular: no CP/no SOB/no palpitations/no leg swelling Respiratory: no cough/no SOB/no wheezing Gastrointestinal: no N/no V/no D/+ C/no acid reflux Musculoskeletal: + muscle aches/+ joint  aches Skin: no rashes, + hair loss Neurological: + tremors/no numbness/no tingling/no dizziness  I reviewed pt's medications, allergies, PMH, social hx, family hx, and changes were documented in the history of present illness. Otherwise, unchanged from my initial visit note.  Past Medical History:  Diagnosis Date  . Acne   . Allergic rhinitis   . GERD (gastroesophageal reflux disease)   . Vertigo    Past Surgical History:  Procedure Laterality Date  . ABDOMINAL HYSTERECTOMY    . BUNIONECTOMY Right   . ECTOPIC PREGNANCY SURGERY    . MYOMECTOMY    . WISDOM TOOTH EXTRACTION     Social History   Social History  . Marital status: Married    Spouse name: N/A  . Number of children: 1   Occupational History  . 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 Smoking status: Former Smoker    Quit date: 11/13/1996  . Smokeless tobacco: Never Used  . Alcohol use Yes     Comment:1-4  glass of wine a month  . Drug use: No   Social History Narrative   Regular Exercise- yes   Current Outpatient Medications on File Prior to Visit  Medication Sig Dispense Refill  . acetaminophen (APAP EXTRA STRENGTH) 500 MG tablet Take 500 mg by mouth every 6 (six) hours as needed.    . Azelaic Acid (FINACEA) 15 % cream Apply topically 2 (two) times daily. After skin is thoroughly washed and patted dry, gently but  thoroughly massage a thin film of azelaic acid cream into the affected area twice daily, in the morning and evening.     Marland Kitchen b complex vitamins tablet Take 1 tablet by mouth daily. 100 tablet 3  . Biotin (BIOTIN MAXIMUM STRENGTH) 10 MG TABS Take 1 tablet by mouth daily.    . cetirizine (ZYRTEC) 10 MG tablet Take 10 mg by mouth daily.      . Cholecalciferol 2000 UNITS TABS Take 1 tablet by mouth daily.    Marland Kitchen estradiol (MINIVELLE) 0.05 MG/24HR patch Place 1 patch onto the skin 2 (two) times a week.    . Magnesium 250 MG TABS Take by mouth daily.    . meclizine (ANTIVERT) 12.5 MG  tablet Take 1 tablet (12.5 mg total) by mouth daily as needed for dizziness. 30 tablet 0  . meloxicam (MOBIC) 15 MG tablet TAKE 1 TABLET(15 MG) BY MOUTH DAILY AS NEEDED FOR PAIN 90 tablet 1  . zolpidem (AMBIEN) 10 MG tablet Take 1 tablet (10 mg total) by mouth at bedtime as needed. for sleep 30 tablet 3   No current facility-administered medications on file prior to visit.    Allergies  Allergen Reactions  . Azithromycin     REACTION: nausea and stomach pains  . Sulfonamide Derivatives    Family History  Problem Relation Age of Onset  . Cancer Mother        neck  . Cancer Sister        glioblastoma  . Diabetes Father   . Lung cancer Father    PE: BP 108/68   Pulse (!) 58   Ht  (1.626 m)   Wt 135 lb 9.6 oz (61.5 kg)   SpO2 99%   BMI 23.28 kg/m  Wt Readings from Last 3 Encounters:  01/23/18 135 lb 9.6 oz (61.5 kg)  08/27/17 140 lb (63.5 kg)  03/06/17 160 lb (72.6 kg)   Constitutional: normal weight, in NAD Eyes: PERRLA, EOMI, no exophthalmos ENT: moist mucous membranes, no thyromegaly, no cervical lymphadenopathy Cardiovascular: RRR, No MRG Respiratory: CTA B Gastrointestinal: abdomen soft, NT, ND, BS+ Musculoskeletal: no deformities, strength intact in all 4 Skin: moist, warm, no rashes Neurological: no tremor with outstretched hands (+ horizontal head tremor), DTR normal in all 4  ASSESSMENT: 1. Multiple thyroid nodules  PLAN: 1. Multiple thyroid nodules - Reviewed the report and images of her thyroid ultrasound from 12/2016:   1) R nodule was not large, however, it was solid, with internal blood flow and not very well the limited from the surrounding tissue.  Possible pseudo-nodule, but difficult to tell.  This nodule was first biopsied in 12/2016 but the sample was inconclusive due to scan material.  We repeated the biopsy in 06/2017 and the result was benign.  2) L nodule appeared to be a composite of 2 nodules, both solid and one with apparent  macrocalcifications.  Both had internal blood flow but not very well-defined from the surrounding tissues.  However, this composite nodule was biopsied and the results were benign.  We discussed that we would not rebiopsy the nodule unless it increases in size or changes ultrasound characteristics in the future.  - She does not have a thyroid cancer family history or personal history of radiation therapy to head or neck.  All these will favor benignity. - At this visit, she denies neck compression symptoms - We discussed that we can continue to follow her on a yearly or every 2 years basis -  Will not repeat the ultrasound for now, but plan to do so before next visit so we can reviewed the images and the reports when she comes back - I advised her to let me know if she develops any neck compression symptoms, in the case, we need to go ahead with the ultrasound sooner. - We will check TFTs in June.  TSH obtained last year was normal. - Otherwise, I will see her in a year  Patient Instructions  Please come back in 1 year.   Please call me before next appt to order a new thyroid U/S.  Stop Biotin 1 week before thyroid labs.  - time spent with the patient:  20 min, of which >50% was spent in obtaining information about her symptoms, reviewing her previous labs and imaging tests, counseling her about her nodules and discussed new updated thyroid nodule guidelines, and developing a plan to further investigate them.   CC: Lynden Ang NP  Carlus Pavlov, MD PhD [facility] Endocrin[facility]

## 2018-01-23 NOTE — Patient Instructions (Addendum)
Please come back in 1 year.   Please call me before next appt to order a new thyroid U/S.  Stop Biotin 1 week before thyroid labs.

## 2018-01-23 NOTE — Assessment & Plan Note (Signed)
Benign - records reviewed

## 2018-01-23 NOTE — Progress Notes (Signed)
Subjective:  Patient ID: Tracy May, female    DOB: Mar 05, 1956  Age: 62 y.o. MRN: [id number]  CC: No chief complaint on file.   HPI Tracy May presents for severe epig pain 2-3 wks ago x20 min (10/10) The 2nd episode happened 2 wks ago on Sat at night x20 min and went away. No n/v.  Outpatient Medications Prior to Visit  Medication Sig Dispense Refill  . acetaminophen (APAP EXTRA STRENGTH) 500 MG tablet Take 500 mg by mouth every 6 (six) hours as needed.    . Azelaic Acid (FINACEA) 15 % cream Apply topically 2 (two) times daily. After skin is thoroughly washed and patted dry, gently but thoroughly massage a thin film of azelaic acid cream into the affected area twice daily, in the morning and evening.     Marland Kitchen b complex vitamins tablet Take 1 tablet by mouth daily. 100 tablet 3  . Biotin (BIOTIN MAXIMUM STRENGTH) 10 MG TABS Take 1 tablet by mouth daily.    . cetirizine (ZYRTEC) 10 MG tablet Take 10 mg by mouth daily.      . Cholecalciferol 2000 UNITS TABS Take 1 tablet by mouth daily.    Marland Kitchen estradiol (MINIVELLE) 0.05 MG/24HR patch Place 1 patch onto the skin 2 (two) times a week.    . Magnesium 250 MG TABS Take by mouth daily.    . meclizine (ANTIVERT) 12.5 MG tablet Take 1 tablet (12.5 mg total) by mouth daily as needed for dizziness. 30 tablet 0  . meloxicam (MOBIC) 15 MG tablet TAKE 1 TABLET(15 MG) BY MOUTH DAILY AS NEEDED FOR PAIN 90 tablet 1  . zolpidem (AMBIEN) 10 MG tablet Take 1 tablet (10 mg total) by mouth at bedtime as needed. for sleep 30 tablet 3   No facility-administered medications prior to visit.     ROS Review of Systems  Constitutional: Negative for activity change, appetite change, chills, fatigue and unexpected weight change.  HENT: Negative for congestion, mouth sores and sinus pressure.   Eyes: Negative for visual disturbance.  Respiratory: Negative for cough and chest tightness.   Cardiovascular: Positive for chest pain.  Gastrointestinal: Positive for  abdominal pain. Negative for nausea.  Genitourinary: Negative for difficulty urinating, frequency and vaginal pain.  Musculoskeletal: Negative for back pain and gait problem.  Skin: Negative for pallor and rash.  Neurological: Negative for dizziness, tremors, weakness, numbness and headaches.  Psychiatric/Behavioral: Negative for confusion and sleep disturbance.    Objective:  BP 112/64 (BP Location: Right Arm, Patient Position: Sitting, Cuff Size: Normal)   Pulse (!) 59   Temp 98.8 F (37.1 C) (Oral)   Ht 5\' 4"  (1.626 m)   Wt 136 lb (61.7 kg)   SpO2 (!) 88%   BMI 23.34 kg/m   BP Readings from Last 3 Encounters:  01/23/18 112/64  01/23/18 108/68  08/27/17 120/82    Wt Readings from Last 3 Encounters:  01/23/18 136 lb (61.7 kg)  01/23/18 135 lb 9.6 oz (61.5 kg)  08/27/17 140 lb (63.5 kg)    Physical Exam  Constitutional: She appears well-developed. No distress.  HENT:  Head: Normocephalic.  Right Ear: External ear normal.  Left Ear: External ear normal.  Nose: Nose normal.  Mouth/Throat: Oropharynx is clear and moist.  Eyes: Pupils are equal, round, and reactive to light. Conjunctivae are normal. Right eye exhibits no discharge. Left eye exhibits no discharge.  Neck: Normal range of motion. Neck supple. No JVD present. No tracheal deviation present. No  thyromegaly present.  Cardiovascular: Normal rate, regular rhythm and normal heart sounds.  Pulmonary/Chest: No stridor. No respiratory distress. She has no wheezes.  Abdominal: Soft. Bowel sounds are normal. She exhibits no distension and no mass. There is no tenderness. There is no rebound and no guarding.  Musculoskeletal: She exhibits no edema or tenderness.  Lymphadenopathy:    She has no cervical adenopathy.  Neurological: She displays normal reflexes. No cranial nerve deficit. She exhibits normal muscle tone. Coordination normal.  Skin: No rash noted. No erythema.  Psychiatric: She has a normal mood and affect.  Her behavior is normal. Judgment and thought content normal.   Sensitive in the RUQ  Lab Results  Component Value Date   WBC 5.4 11/10/2016   HGB 13.8 11/10/2016   HCT 40.5 11/10/2016   PLT 337.0 11/10/2016   GLUCOSE 96 11/10/2016   CHOL 219 (H) 11/10/2016   TRIG 40.0 11/10/2016   HDL 76.60 11/10/2016   LDLDIRECT 102.3 10/16/2012   LDLCALC 135 (H) 11/10/2016   ALT 15 11/10/2016   AST 12 11/10/2016   NA 139 11/10/2016   K 4.1 11/10/2016   CL 105 11/10/2016   CREATININE 0.82 11/10/2016   BUN 15 11/10/2016   CO2 27 11/10/2016   TSH 1.19 11/10/2016    Mr Brain W Wo Contrast  Result Date: 09/29/2017 CLINICAL DATA:  Trigeminal neuralgia. RIGHT-sided lip numbness. Family history of GBM. EXAM: MRI HEAD WITHOUT AND WITH CONTRAST TECHNIQUE: Multiplanar, multiecho pulse sequences of the brain and surrounding structures were obtained without and with intravenous contrast. CONTRAST:  83mL MULTIHANCE GADOBENATE DIMEGLUMINE 529 MG/ML IV SOLN COMPARISON:  None. FINDINGS: Brain: No evidence for acute infarction, hemorrhage, mass lesion, hydrocephalus, or extra-axial fluid. Normal cerebral volume. No significant white matter disease. Post infusion, there is no abnormal enhancement of the brain or meninges except for a small incidental LEFT frontal venous angioma. No associated blood products. The study was not performed as a dedicated trigeminal examination, but there are no abnormalities in the region of the fifth nerve exit zone. Normal cavernous sinuses. No brainstem lesions. No foci concerning for demyelinating disease. Vascular: Flow voids are maintained. Skull and upper cervical spine: Advanced chronic degenerative disc disease at C3-4, without apparent stenosis at this region, but with endplate reactive changes above and below. No worrisome marrow signal changes elsewhere. Partial empty sella. Sinuses/Orbits: No layering sinus fluid or significant opacity. The orbits appear negative. Other:  Unremarkable. IMPRESSION: Negative exam. No acute intracranial findings. No evidence for occult brain tumor. Incidental LEFT frontal venous angioma, of no clinical significance. Electronically Signed   By: Staci Righter M.D.   On: 09/29/2017 12:59    Assessment & Plan:   There are no diagnoses linked to this encounter. I am having Tracy May maintain her Azelaic Acid, cetirizine, acetaminophen, Cholecalciferol, Biotin, estradiol, Magnesium, zolpidem, meclizine, meloxicam, and b complex vitamins.  No orders of the defined types were placed in this encounter.    Follow-up: No follow-ups on file.  Walker Kehr, MD

## 2018-01-24 NOTE — Addendum Note (Signed)
Addended by: Scarlett Presto on: 01/24/2018 11:33 AM   Modules accepted: Orders

## 2018-02-01 ENCOUNTER — Ambulatory Visit (INDEPENDENT_AMBULATORY_CARE_PROVIDER_SITE_OTHER): Payer: 59 | Admitting: Neurology

## 2018-02-01 ENCOUNTER — Other Ambulatory Visit (INDEPENDENT_AMBULATORY_CARE_PROVIDER_SITE_OTHER): Payer: 59

## 2018-02-01 DIAGNOSIS — Z0000 Encounter for general adult medical examination without abnormal findings: Secondary | ICD-10-CM | POA: Diagnosis not present

## 2018-02-01 DIAGNOSIS — R1013 Epigastric pain: Secondary | ICD-10-CM

## 2018-02-01 DIAGNOSIS — G243 Spasmodic torticollis: Secondary | ICD-10-CM

## 2018-02-01 LAB — URINALYSIS
BILIRUBIN URINE: NEGATIVE
Hgb urine dipstick: NEGATIVE
Ketones, ur: NEGATIVE
LEUKOCYTES UA: NEGATIVE
NITRITE: NEGATIVE
SPECIFIC GRAVITY, URINE: 1.015 (ref 1.000–1.030)
Total Protein, Urine: NEGATIVE
Urine Glucose: NEGATIVE
Urobilinogen, UA: 0.2 (ref 0.0–1.0)
pH: 7.5 (ref 5.0–8.0)

## 2018-02-01 LAB — CBC WITH DIFFERENTIAL/PLATELET
BASOS ABS: 0 10*3/uL (ref 0.0–0.1)
Basophils Relative: 0.7 % (ref 0.0–3.0)
Eosinophils Absolute: 0.1 10*3/uL (ref 0.0–0.7)
Eosinophils Relative: 2.7 % (ref 0.0–5.0)
HEMATOCRIT: 38.6 % (ref 36.0–46.0)
Hemoglobin: 13.1 g/dL (ref 12.0–15.0)
LYMPHS PCT: 37 % (ref 12.0–46.0)
Lymphs Abs: 1.8 10*3/uL (ref 0.7–4.0)
MCHC: 33.9 g/dL (ref 30.0–36.0)
MCV: 93.7 fl (ref 78.0–100.0)
MONOS PCT: 7.9 % (ref 3.0–12.0)
Monocytes Absolute: 0.4 10*3/uL (ref 0.1–1.0)
Neutro Abs: 2.5 10*3/uL (ref 1.4–7.7)
Neutrophils Relative %: 51.7 % (ref 43.0–77.0)
Platelets: 328 10*3/uL (ref 150.0–400.0)
RBC: 4.12 Mil/uL (ref 3.87–5.11)
RDW: 12.8 % (ref 11.5–15.5)
WBC: 4.9 10*3/uL (ref 4.0–10.5)

## 2018-02-01 LAB — LIPID PANEL
CHOL/HDL RATIO: 3
Cholesterol: 208 mg/dL — ABNORMAL HIGH (ref 0–200)
HDL: 76 mg/dL (ref >39.00)
LDL Cholesterol: 126 mg/dL — ABNORMAL HIGH (ref 0–99)
NONHDL: 131.69
Triglycerides: 30 mg/dL (ref 0.0–149.0)
VLDL: 6 mg/dL (ref 0.0–40.0)

## 2018-02-01 LAB — BASIC METABOLIC PANEL
BUN: 12 mg/dL (ref 6–23)
CALCIUM: 9.1 mg/dL (ref 8.4–10.5)
CO2: 28 meq/L (ref 19–32)
CREATININE: 0.79 mg/dL (ref 0.40–1.20)
Chloride: 104 mEq/L (ref 96–112)
GFR: 95.38 mL/min (ref >60.00)
GLUCOSE: 83 mg/dL (ref 70–99)
Potassium: 4 mEq/L (ref 3.5–5.1)
Sodium: 138 mEq/L (ref 135–145)

## 2018-02-01 LAB — HEPATIC FUNCTION PANEL
ALBUMIN: 3.8 g/dL (ref 3.5–5.2)
ALT: 16 U/L (ref 0–35)
AST: 14 U/L (ref 0–37)
Alkaline Phosphatase: 41 U/L (ref 39–117)
BILIRUBIN DIRECT: 0.1 mg/dL (ref 0.0–0.3)
Total Bilirubin: 0.5 mg/dL (ref 0.2–1.2)
Total Protein: 6.2 g/dL (ref 6.0–8.3)

## 2018-02-01 LAB — TSH: TSH: 0.92 u[IU]/mL (ref 0.35–4.50)

## 2018-02-01 MED ORDER — ONABOTULINUMTOXINA 100 UNITS IJ SOLR
235.0000 [IU] | Freq: Once | INTRAMUSCULAR | Status: AC
  Administered 2018-02-01: 235 [IU] via INTRAMUSCULAR

## 2018-02-01 NOTE — Procedures (Signed)
[facility]   Procedure Note Botox  Attending: Dr. Lurena Joiner Elleanna Melling  Preoperative Diagnosis(es): Cervical Dystonia  Result History  Pt noting much improvement in head tremor and neck pain is now gone.    Consent obtained from: The patient Benefits discussed included, but were not limited to decreased muscle tightness, increased joint range of motion, and decreased pain.  Risk discussed included, but were not limited pain and discomfort, bleeding, bruising, excessive weakness, venous thrombosis, muscle atrophy and dysphagia.  A copy of the patient medication guide was given to the patient which explains the blackbox warning.  Patients identity and treatment sites confirmed Yes.  .  Details of Procedure: Skin was cleaned with alcohol.   Prior to injection, the needle plunger was aspirated to make sure the needle was not within a blood vessel.  There was no blood retrieved on aspiration.    Following is a summary of the muscles injected  And the amount of Botulinum toxin used:   Dilution 0.9% preservative free saline mixed with 100 u Botox type A to make 10 U per 0.1cc  Injections  Location Left  Right Units Number of sites        Sternocleidomastoid  60 60 1  Splenius Capitus, posterior approach 100  100 1  Splenius Capitus, lateral approach 40  40 1  Levator Scapulae      Trapezius  20/15 40 2        TOTAL UNITS:   240    Agent: Botulinum Type A ( Onobotulinum Toxin type A ).  2 vials of Botox were used, each containing 100 units and freshly diluted with 1 mL of sterile, non-preserved saline   Total injected (Units): 235  Total wasted (Units): 0   Pt tolerated procedure well without complications.   Reinjection is anticipated in 3 months.

## 2018-02-02 ENCOUNTER — Encounter: Payer: Self-pay | Admitting: Internal Medicine

## 2018-02-05 IMAGING — DX DG CHEST 2V
2 series · 2 of 2 positions shown · non-contrast
Comparison: 09/30/2013 .

CLINICAL DATA: Wheezing.  Shortness of breath .

EXAM:
CHEST  2 VIEW

[chest pa]
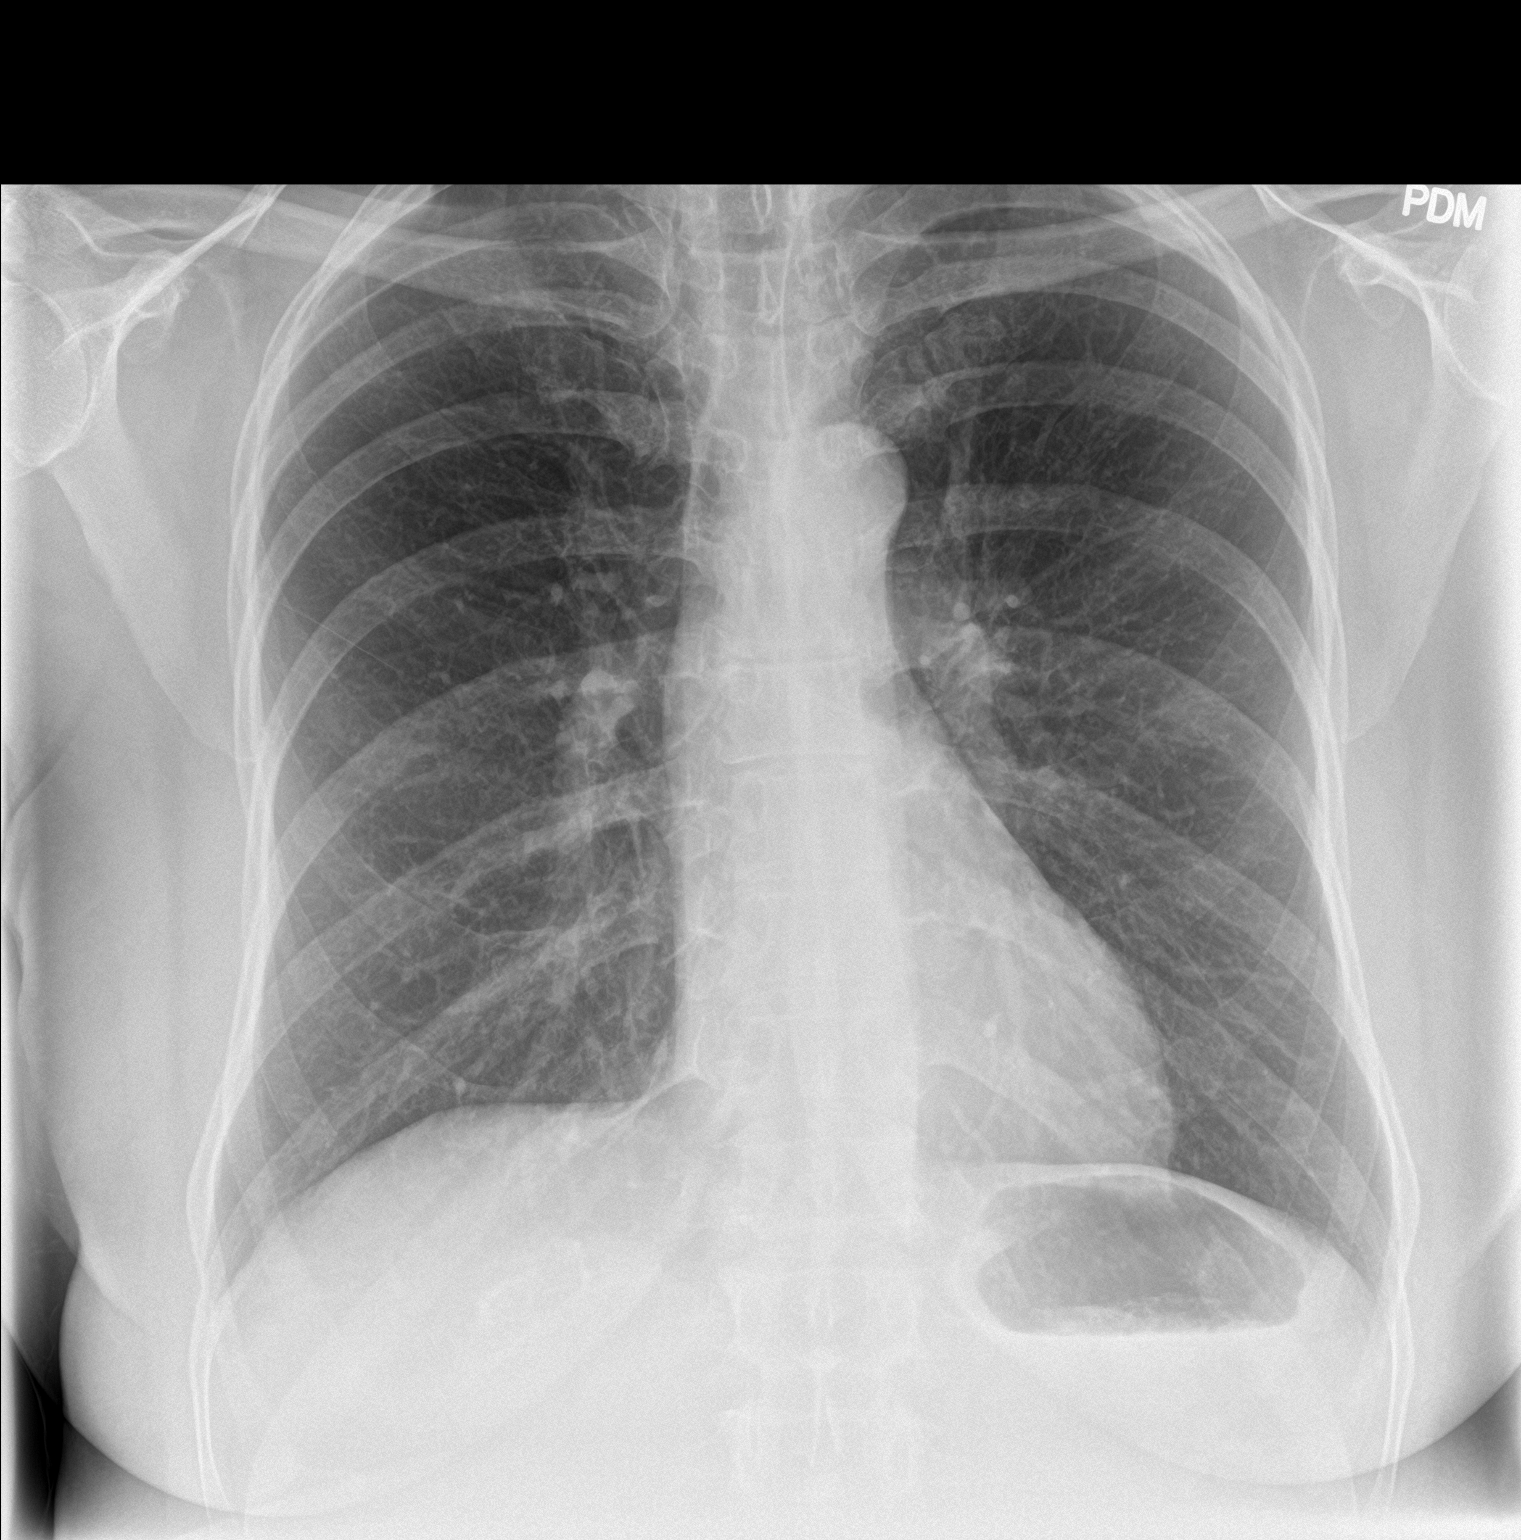

[chest lat]
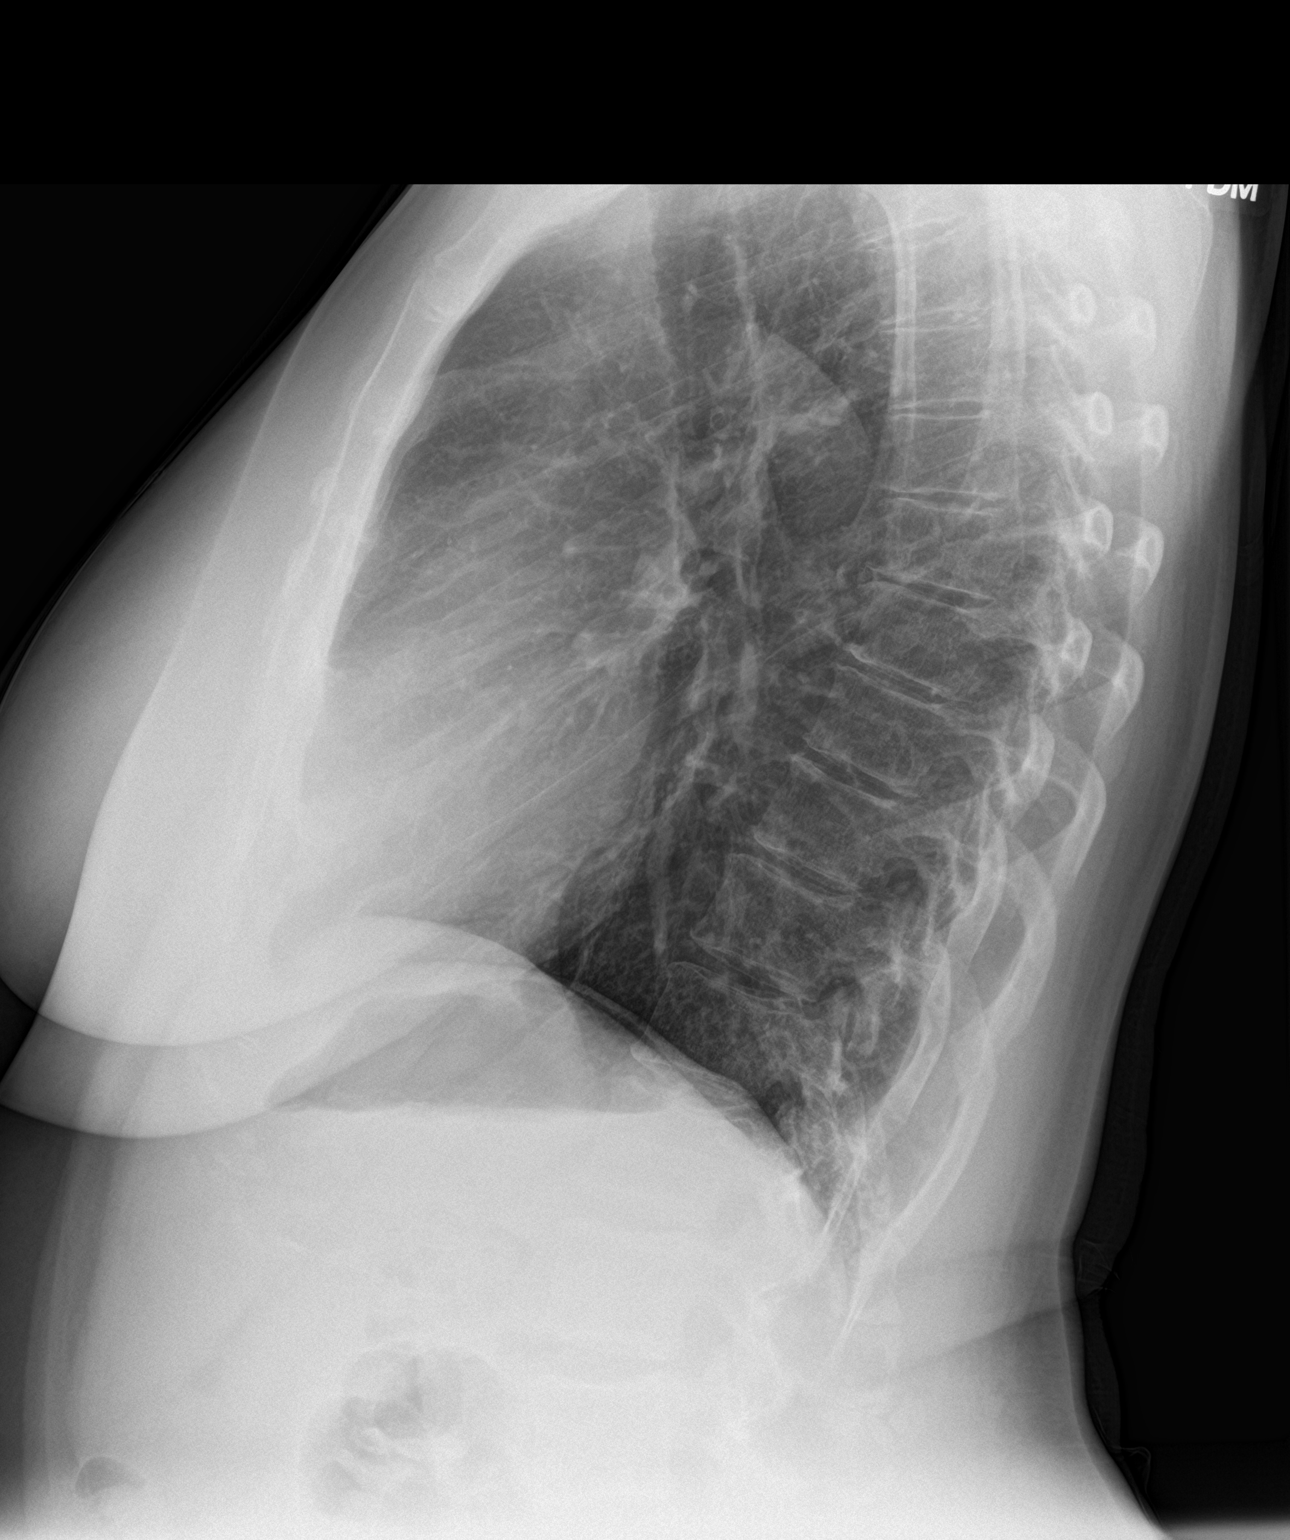

[2 of 2 positions shown; findings below may reference images not displayed]

FINDINGS: Mediastinum and hilar structures normal. Lungs are clear. No pleural
effusion or pneumothorax. Heart size normal. No acute bony
abnormality.
IMPRESSION: No acute cardiopulmonary disease.

## 2018-02-06 ENCOUNTER — Telehealth: Payer: Self-pay | Admitting: Neurology

## 2018-02-06 DIAGNOSIS — G243 Spasmodic torticollis: Secondary | ICD-10-CM

## 2018-02-06 NOTE — Telephone Encounter (Signed)
Mychart message sent to patient.

## 2018-02-07 ENCOUNTER — Encounter (INDEPENDENT_AMBULATORY_CARE_PROVIDER_SITE_OTHER): Payer: Self-pay

## 2018-02-07 ENCOUNTER — Ambulatory Visit
Admission: RE | Admit: 2018-02-07 | Discharge: 2018-02-07 | Disposition: A | Discharge status: Home / Self Care | Payer: 59 | Source: Ambulatory Visit | Attending: Internal Medicine | Admitting: Internal Medicine

## 2018-02-07 DIAGNOSIS — N200 Calculus of kidney: Secondary | ICD-10-CM | POA: Diagnosis not present

## 2018-02-07 DIAGNOSIS — R1013 Epigastric pain: Secondary | ICD-10-CM

## 2018-02-07 NOTE — Addendum Note (Signed)
Addended bySilvio Pate on: 02/07/2018 12:16 PM   Modules accepted: Orders

## 2018-02-07 NOTE — Telephone Encounter (Signed)
Referral faxed to [facility] at [phone] with confirmation received.

## 2018-02-12 DIAGNOSIS — G243 Spasmodic torticollis: Secondary | ICD-10-CM | POA: Diagnosis not present

## 2018-02-12 DIAGNOSIS — M542 Cervicalgia: Secondary | ICD-10-CM | POA: Diagnosis not present

## 2018-02-15 ENCOUNTER — Ambulatory Visit: Payer: Self-pay | Admitting: Surgery

## 2018-02-15 DIAGNOSIS — K8050 Calculus of bile duct without cholangitis or cholecystitis without obstruction: Secondary | ICD-10-CM | POA: Diagnosis not present

## 2018-02-15 NOTE — H&P (Signed)
Tracy May Documented: 02/15/2018 10:17 AM Location: [facility] Patient #: [phone] DOB: [birth year] Married / Language: English / Race: Black or African American Female  History of Present Illness (Kenli Waldo A. Fredricka Bonine MD; 02/15/2018 10:39 AM) Patient words: This is a very pleasant and otherwise healthy 62 year old woman who presents with biliary colic. In the last several months she has had a couple of attacks of severe subxiphoid and epigastric pain which radiates to both subcostal margins. Essentially with mild nausea, no emesis or fevers. She also has almost constant pain in the right lateral abdomen which she is able to ignore when she is dizzy. Denies any loss of appetite or lower GI symptoms. She underwent an ultrasound that did show cholelithiasis as well as a 7-1/2 mm right nonobstructing kidney stone for which she is going to see a urologist next week.  She has had a transvaginal hysterectomy as well as a laparoscopic abdominal surgery for ectopic pregnancy. No tobacco or recreational drug use. 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 She is here today with her husband.  The patient is a 62 year old female.   Past Surgical History (Tanisha A. Manson Passey, RMA; 02/15/2018 10:18 AM) Foot Surgery Right. Hysterectomy (due to cancer) - Partial Hysterectomy (not due to cancer) - Partial Oral Surgery  Diagnostic Studies History (Tanisha A. Manson Passey, RMA; 02/15/2018 10:18 AM) Colonoscopy 5-10 years ago Mammogram within last year Pap Smear >5 years ago  Allergies (Tanisha A. Manson Passey, RMA; 02/15/2018 10:19 AM) Sulfa Antibiotics AZITHROMYCIN Abdominal pain.  Medication History (Tanisha A. Manson Passey, RMA; 02/15/2018 10:21 AM) Roselind Rily (0.05MG /59DG Patch TW, Transdermal) Active. Zolpidem Tartrate (  Tablet, Oral) Active. B Complex-B12 (Oral) Active. Meloxicam (  Tablet, Oral) Active. Meclizine HCl (12.5MG  Tablet, Oral) Active. Biotin  (Oral) Specific strength unknown - Active. Medications Reconciled  Social History (Tanisha A. Manson Passey, RMA; 02/15/2018 10:18 AM) Alcohol use Occasional alcohol use. Caffeine use Carbonated beverages, Coffee. No drug use Tobacco use Former smoker.  Family History (Tanisha A. Manson Passey, RMA; 02/15/2018 10:18 AM) Arthritis Mother. Cancer Father, Mother, Sister. Cerebrovascular Accident Father. Colon Polyps Sister. Depression Mother, Sister. Hypertension Daughter, Father. Prostate Cancer Father.  Pregnancy / Birth History (Tanisha A. Manson Passey, RMA; 02/15/2018 10:18 AM) Age at menarche 15 years. Age of menopause [age] Gravida 2 Irregular periods Maternal age 96-25 Para 1  Other Problems (Tanisha A. Manson Passey, RMA; 02/15/2018 10:18 AM) Asthma Cholelithiasis General anesthesia - complications Kidney Stone Other disease, cancer, significant illness Thyroid Disease     Review of Systems (Lenya Sterne A. Fredricka Bonine MD; 02/15/2018 10:39 AM) General Not Present- Appetite Loss, Chills, Fatigue, Fever, Night Sweats, Weight Gain and Weight Loss. Skin Not Present- Change in Wart/Mole, Dryness, Hives, Jaundice, New Lesions, Non-Healing Wounds, Rash and Ulcer. HEENT Present- Wears glasses/contact lenses. Not Present- Earache, Hearing Loss, Hoarseness, Nose Bleed, Oral Ulcers, Ringing in the Ears, Seasonal Allergies, Sinus Pain, Sore Throat, Visual Disturbances and Yellow Eyes. Respiratory Not Present- Bloody sputum, Chronic Cough, Difficulty Breathing, Snoring and Wheezing. Breast Present- Breast Pain. Not Present- Breast Mass, Nipple Discharge and Skin Changes. Cardiovascular Not Present- Chest Pain, Difficulty Breathing Lying Down, Leg Cramps, Palpitations, Rapid Heart Rate, Shortness of Breath and Swelling of Extremities. Gastrointestinal Present- Abdominal Pain and Constipation. Not Present- Bloating, Bloody Stool, Change in Bowel Habits, Chronic diarrhea, Difficulty Swallowing, Excessive  gas, Gets full quickly at meals, Hemorrhoids, Indigestion, Nausea, Rectal Pain and Vomiting. Female Genitourinary Not Present- Frequency, Nocturia, Painful Urination, Pelvic Pain and Urgency. Musculoskeletal Present- Joint Stiffness and Muscle  Pain. Not Present- Back Pain, Joint Pain, Muscle Weakness and Swelling of Extremities. Neurological Present- Headaches and Tremor. Not Present- Decreased Memory, Fainting, Numbness, Seizures, Tingling, Trouble walking and Weakness. Psychiatric Not Present- Anxiety, Bipolar, Change in Sleep Pattern, Depression, Fearful and Frequent crying. Endocrine Not Present- Cold Intolerance, Excessive Hunger, Hair Changes, Heat Intolerance, Hot flashes and New Diabetes. Hematology Not Present- Blood Thinners, Easy Bruising, Excessive bleeding, Gland problems, HIV and Persistent Infections. All other systems negative  Vitals (Tanisha A. Brown RMA; 02/15/2018 10:18 AM) 02/15/2018 10:18 AM Weight: 135.2 lb Height: 64in Body Surface Area: 1.66 m Body Mass Index: 23.21 kg/m  Temp.: 96.85F  Pulse: 84 (Regular)  BP: 110/64 (Sitting, Left Arm, Standard)      Physical Exam (Natori Gudino A. Fredricka Bonine MD; 02/15/2018 10:40 AM)  The physical exam findings are as follows: Note:Gen: alert and well appearing Eye: extraocular motion intact, no scleral icterus ENT: moist mucus membranes, dentition intact Neck: no mass or thyromegaly Chest: unlabored respirations, symmetrical air entry, clear bilaterally CV: regular rate and rhythm, no pedal edema Abdomen: soft, nontender, nondistended. No mass or organomegaly MSK: strength symmetrical throughout, no deformity Neuro: grossly intact, normal gait Psych: normal mood and affect, appropriate insight Skin: warm and dry, no rash or lesion on limited exam    Assessment & Plan (China Deitrick A. Fredricka Bonine MD; 02/15/2018 10:41 AM)  BILIARY COLIC (K80.50) Story: We discussed options of low-fat diet and observation with the risks of  cholecystitis, pancreatitis, cholangitis, etc. Also discussed laparoscopic cholecystectomy with risk of bleeding, infection, pain, scarring, intra-abdominal injury specifically to the common bile duct and sequelae, conversion to open surgery, incisional hernia, failure to resolve symptoms, general risks of blood clot, heart attack, pneumonia, stroke, etc. Questions were welcomed and answered. If any lithotripsy plans are made for the renal stone, I told her that I be happy to coordinate with her urologist that we could perhaps only subject her to one anesthesia event. She would like to go ahead and schedule today and will let us know if this needs to be coordinated.

## 2018-02-15 NOTE — H&P (View-Only) (Signed)
Lore Wessner Documented: 02/15/2018 10:17 AM Location: [facility] Patient #: [id number] DOB: 12/12/1955 Married / Language: English / Race: Black or African American Female  History of Present Illness (Merrin Mcvicker A. Milly Goggins MD; 02/15/2018 10:39 AM) Patient words: This is a very pleasant and otherwise healthy 62-year-old woman who presents with biliary colic. In the last several months she has had a couple of attacks of severe subxiphoid and epigastric pain which radiates to both subcostal margins. Essentially with mild nausea, no emesis or fevers. She also has almost constant pain in the right lateral abdomen which she is able to ignore when she is dizzy. Denies any loss of appetite or lower GI symptoms. She underwent an ultrasound that did show cholelithiasis as well as a 7-1/2 mm right nonobstructing kidney stone for which she is going to see a urologist next week.  She has had a transvaginal hysterectomy as well as a laparoscopic abdominal surgery for ectopic pregnancy. No tobacco or recreational drug use. 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  The patient is a 62 year old female.   Past Surgical History (Tanisha A. Brown, RMA; 02/15/2018 10:18 AM) Foot Surgery Right. Hysterectomy (due to cancer) - Partial Hysterectomy (not due to cancer) - Partial Oral Surgery  Diagnostic Studies History (Tanisha A. Brown, RMA; 02/15/2018 10:18 AM) Colonoscopy 5-10 years ago Mammogram within last year Pap Smear >5 years ago  Allergies (Tanisha A. Brown, RMA; 02/15/2018 10:19 AM) Sulfa Antibiotics AZITHROMYCIN Abdominal pain.  Medication History (Tanisha A. Brown, RMA; 02/15/2018 10:21 AM) Vivelle-Dot (0.05MG/24HR Patch TW, Transdermal) Active. Zolpidem Tartrate (10MG Tablet, Oral) Active. B Complex-B12 (Oral) Active. Meloxicam (15MG Tablet, Oral) Active. Meclizine HCl (12.5MG Tablet, Oral) Active. Biotin  (Oral) Specific strength unknown - Active. Medications Reconciled  Social History (Tanisha A. Brown, RMA; 02/15/2018 10:18 AM) Alcohol use Occasional alcohol use. Caffeine use Carbonated beverages, Coffee. No drug use Tobacco use Former smoker.  Family History (Tanisha A. Brown, RMA; 02/15/2018 10:18 AM) Arthritis Mother. Cancer Father, Mother, Sister. Cerebrovascular Accident Father. Colon Polyps Sister. Depression Mother, Sister. Hypertension Daughter, Father. Prostate Cancer Father.  Pregnancy / Birth History (Tanisha A. Brown, RMA; 02/15/2018 10:18 AM) Age at menarche 15 years. Age of menopause 56-60 Gravida 2 Irregular periods Maternal age 21-25 Para 1  Other Problems (Tanisha A. Brown, RMA; 02/15/2018 10:18 AM) Asthma Cholelithiasis General anesthesia - complications Kidney Stone Other disease, cancer, significant illness Thyroid Disease     Review of Systems (Harveer Sadler A. Dannon Nguyenthi MD; 02/15/2018 10:39 AM) General Not Present- Appetite Loss, Chills, Fatigue, Fever, Night Sweats, Weight Gain and Weight Loss. Skin Not Present- Change in Wart/Mole, Dryness, Hives, Jaundice, New Lesions, Non-Healing Wounds, Rash and Ulcer. HEENT Present- Wears glasses/contact lenses. Not Present- Earache, Hearing Loss, Hoarseness, Nose Bleed, Oral Ulcers, Ringing in the Ears, Seasonal Allergies, Sinus Pain, Sore Throat, Visual Disturbances and Yellow Eyes. Respiratory Not Present- Bloody sputum, Chronic Cough, Difficulty Breathing, Snoring and Wheezing. Breast Present- Breast Pain. Not Present- Breast Mass, Nipple Discharge and Skin Changes. Cardiovascular Not Present- Chest Pain, Difficulty Breathing Lying Down, Leg Cramps, Palpitations, Rapid Heart Rate, Shortness of Breath and Swelling of Extremities. Gastrointestinal Present- Abdominal Pain and Constipation. Not Present- Bloating, Bloody Stool, Change in Bowel Habits, Chronic diarrhea, Difficulty Swallowing, Excessive  gas, Gets full quickly at meals, Hemorrhoids, Indigestion, Nausea, Rectal Pain and Vomiting. Female Genitourinary Not Present- Frequency, Nocturia, Painful Urination, Pelvic Pain and Urgency. Musculoskeletal Present- Joint Stiffness and Muscle   Pain. Not Present- Back Pain, Joint Pain, Muscle Weakness and Swelling of Extremities. Neurological Present- Headaches and Tremor. Not Present- Decreased Memory, Fainting, Numbness, Seizures, Tingling, Trouble walking and Weakness. Psychiatric Not Present- Anxiety, Bipolar, Change in Sleep Pattern, Depression, Fearful and Frequent crying. Endocrine Not Present- Cold Intolerance, Excessive Hunger, Hair Changes, Heat Intolerance, Hot flashes and New Diabetes. Hematology Not Present- Blood Thinners, Easy Bruising, Excessive bleeding, Gland problems, HIV and Persistent Infections. All other systems negative  Vitals (Tanisha A. Brown RMA; 02/15/2018 10:18 AM) 02/15/2018 10:18 AM Weight: 135.2 lb Height: 64in Body Surface Area: 1.66 m Body Mass Index: 23.21 kg/m  Temp.: 96.85F  Pulse: 84 (Regular)  BP: 110/64 (Sitting, Left Arm, Standard)      Physical Exam (Mihran Lebarron A. Fredricka Bonine MD; 02/15/2018 10:40 AM)  The physical exam findings are as follows: Note:Gen: alert and well appearing Eye: extraocular motion intact, no scleral icterus ENT: moist mucus membranes, dentition intact Neck: no mass or thyromegaly Chest: unlabored respirations, symmetrical air entry, clear bilaterally CV: regular rate and rhythm, no pedal edema Abdomen: soft, nontender, nondistended. No mass or organomegaly MSK: strength symmetrical throughout, no deformity Neuro: grossly intact, normal gait Psych: normal mood and affect, appropriate insight Skin: warm and dry, no rash or lesion on limited exam    Assessment & Plan (Ady Heimann A. Fredricka Bonine MD; 02/15/2018 10:41 AM)  BILIARY COLIC (K80.50) Story: We discussed options of low-fat diet and observation with the risks of  cholecystitis, pancreatitis, cholangitis, etc. Also discussed laparoscopic cholecystectomy with risk of bleeding, infection, pain, scarring, intra-abdominal injury specifically to the common bile duct and sequelae, conversion to open surgery, incisional hernia, failure to resolve symptoms, general risks of blood clot, heart attack, pneumonia, stroke, etc. Questions were welcomed and answered. If any lithotripsy plans are made for the renal stone, I told her that I be happy to coordinate with her urologist that we could perhaps only subject her to one anesthesia event. She would like to go ahead and schedule today and will let us know if this needs to be coordinated.

## 2018-02-20 DIAGNOSIS — M542 Cervicalgia: Secondary | ICD-10-CM | POA: Diagnosis not present

## 2018-02-20 DIAGNOSIS — G243 Spasmodic torticollis: Secondary | ICD-10-CM | POA: Diagnosis not present

## 2018-02-22 DIAGNOSIS — G243 Spasmodic torticollis: Secondary | ICD-10-CM | POA: Diagnosis not present

## 2018-02-22 DIAGNOSIS — M542 Cervicalgia: Secondary | ICD-10-CM | POA: Diagnosis not present

## 2018-02-23 IMAGING — MR MR CERVICAL SPINE W/O CM
4 of 5 series · 21 of 48 positions shown · non-contrast
Comparison: None.

CLINICAL DATA: Acute onset of right-sided neck, jaw, and shoulder
pain.

EXAM:
MRI CERVICAL SPINE WITHOUT CONTRAST
TECHNIQUE: Multiplanar, multisequence MR imaging of the cervical spine was
performed. No intravenous contrast was administered.

[Series 3: T2 · sagittal · 3.0mm · 0.41mm/px · 8 of 15 slices shown (1 of 3)]
[im 1/15]
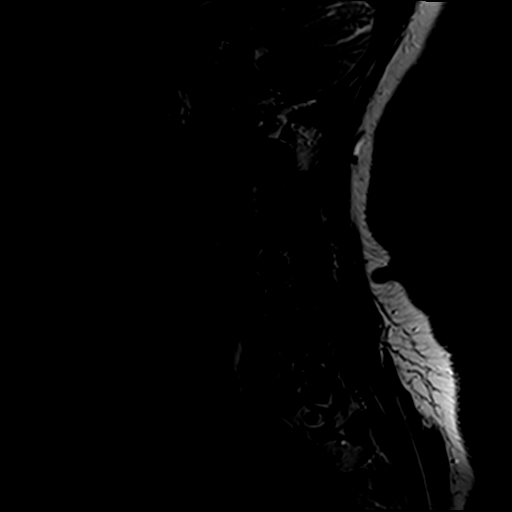
[im 3/15]
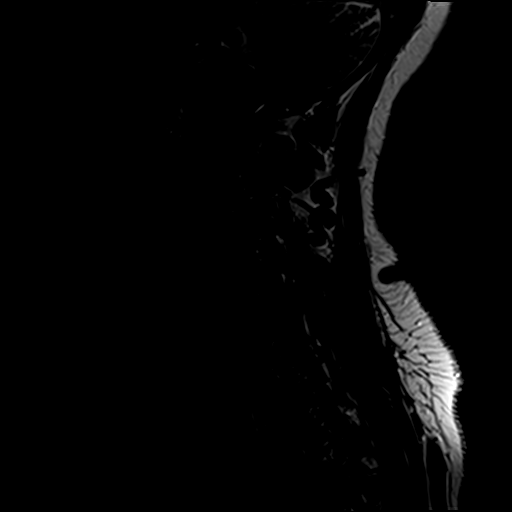
[im 5/15]
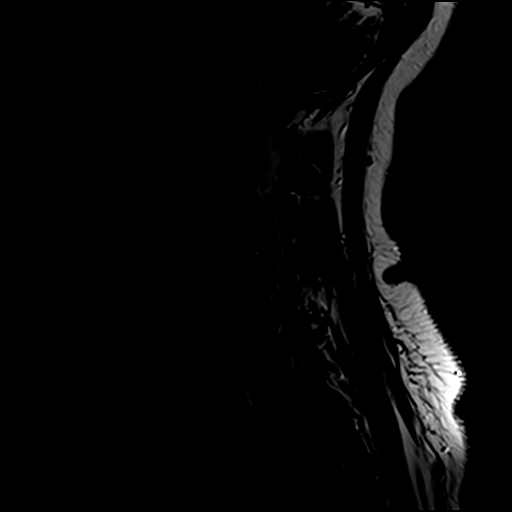
[im 7/15]
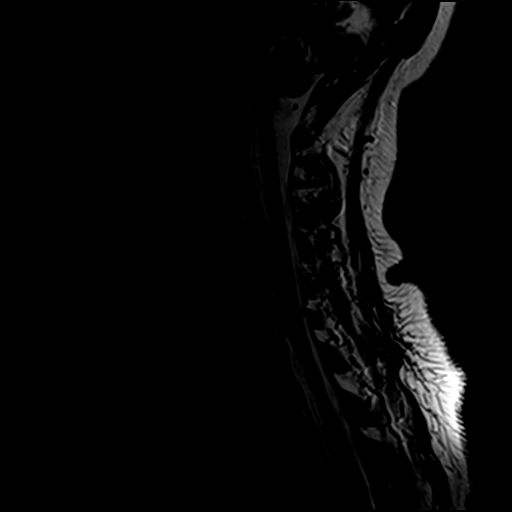
[im 9/15]
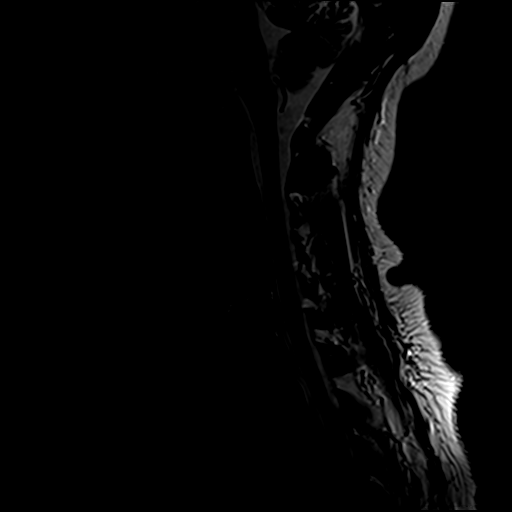
[im 11/15]
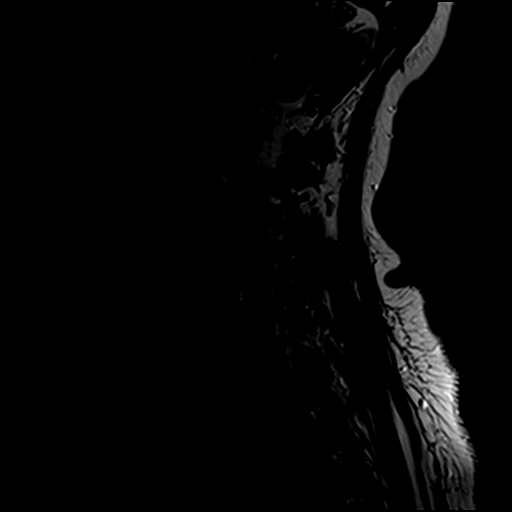
[im 13/15]
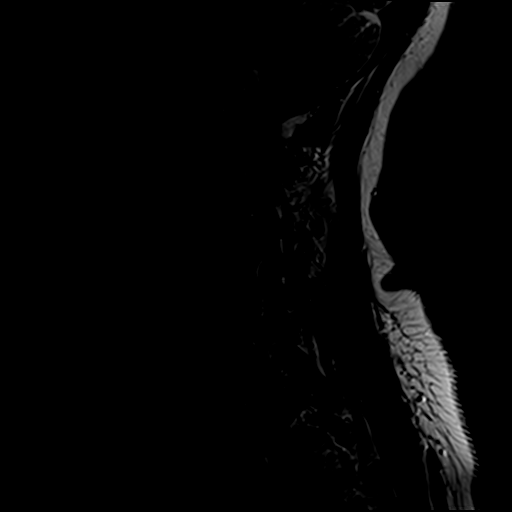
[im 15/15]
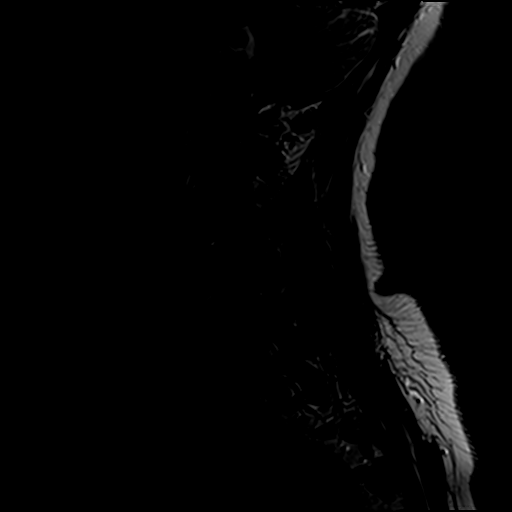

[Series 4: T1 · sagittal · 3.0mm · 0.41mm/px · 3 of 15 slices shown]
[im 3/15]
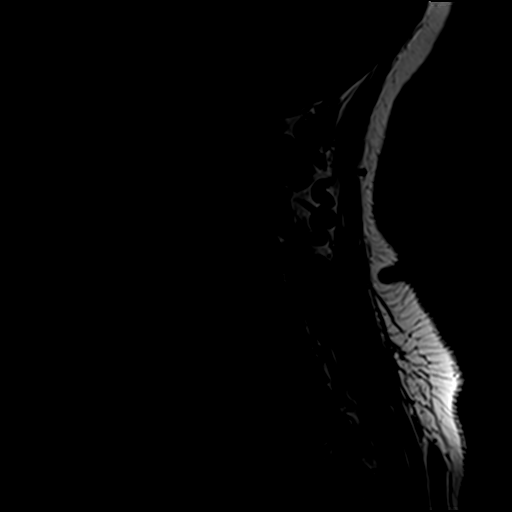
[im 8/15]
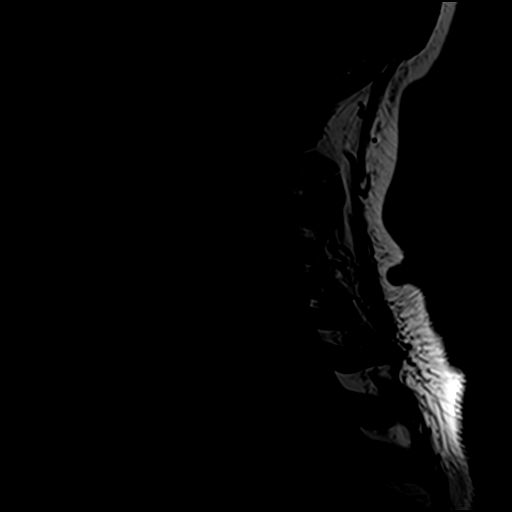
[im 12/15]
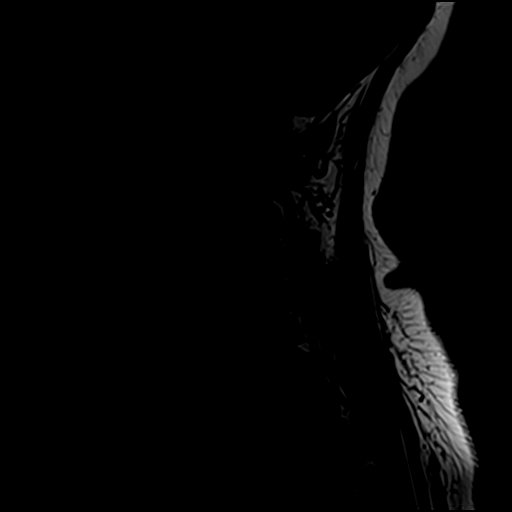

[Series 6: T2 · axial · 3.0mm · 0.39mm/px · z∈[-33,+45]mm · 7 of 27 slices shown (2 of 3)]
[im 1/27]
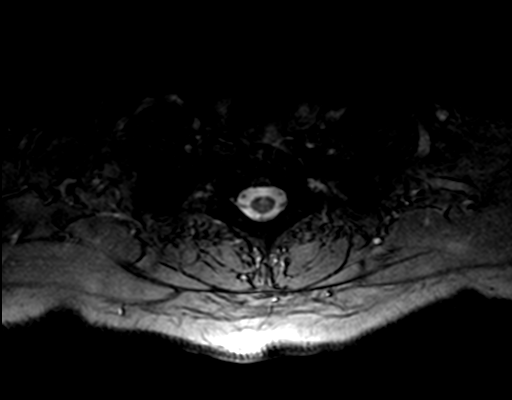
[im 5/27]
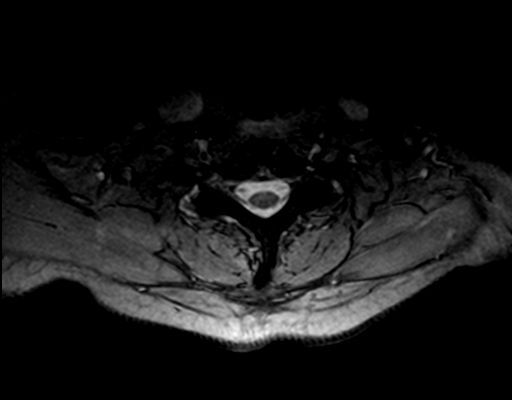
[im 9/27]
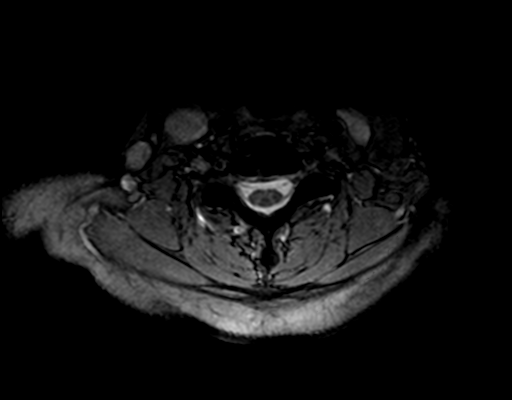
[im 11/27]
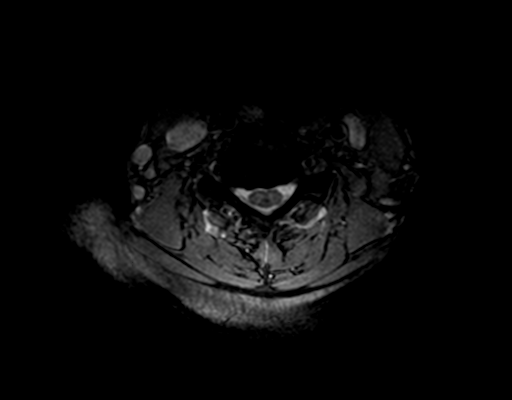
[im 14/27]
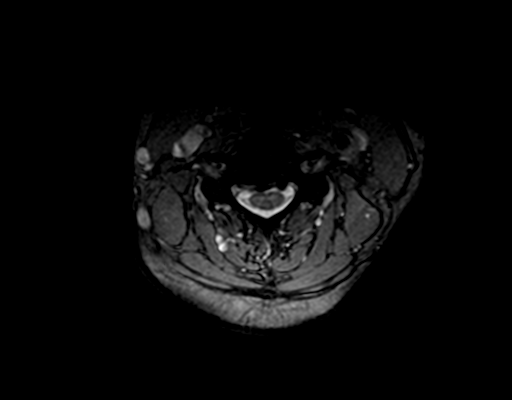
[im 16/27]
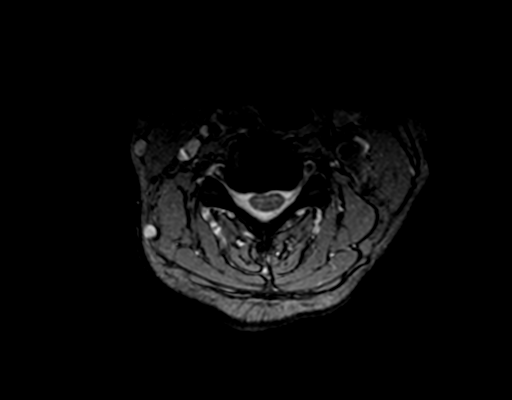
[im 22/27]
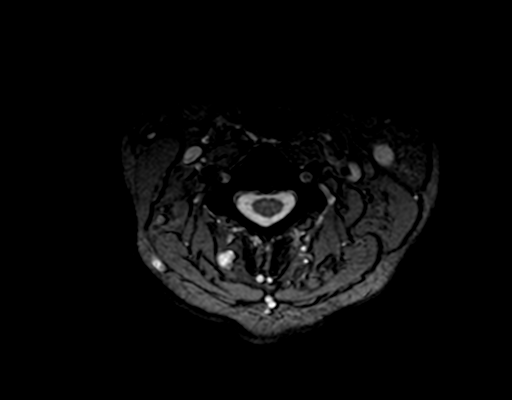

[Series 7: T2 · axial · 3.0mm · 0.39mm/px · z∈[-18,+44]mm · 3 of 27 slices shown (3 of 3)]
[im 5/27]
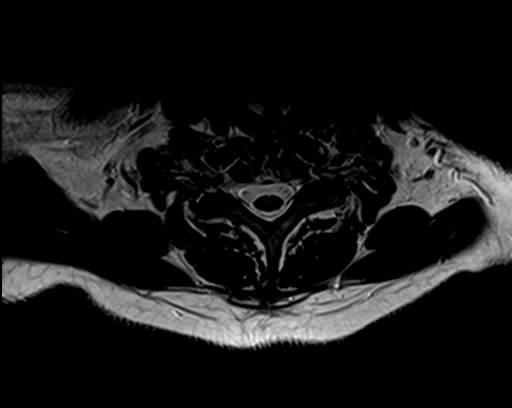
[im 14/27]
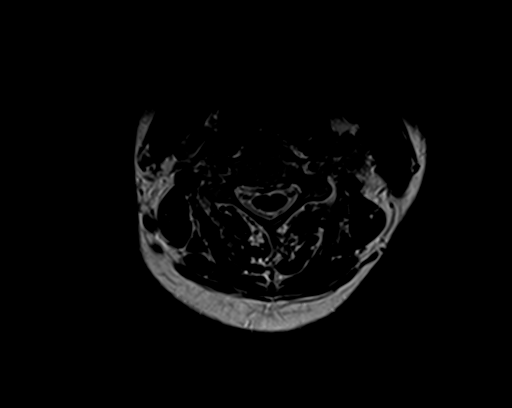
[im 22/27]
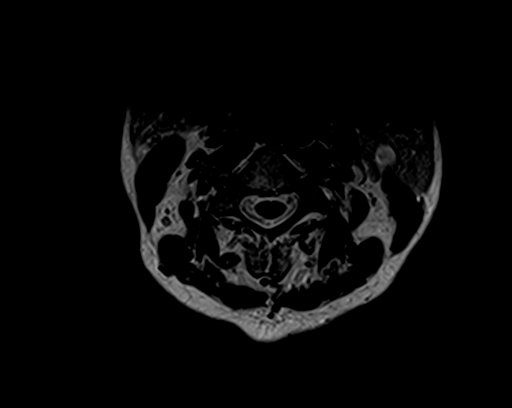

[21 of 48 positions shown; findings below may reference images not displayed]

FINDINGS: Alignment: Straightening of the cervical spine. Negative for
listhesis.

Vertebrae: No fracture, evidence of discitis, or bone lesion.
Degenerative alteration of marrow around the C3-4 and C5-6 discs.

Cord: Normal signal and morphology.

Posterior Fossa, vertebral arteries, paraspinal tissues: 15 mm left
thyroid nodule best seen on sagittal acquisition.

Disc levels:

C2-3: Unremarkable.

C3-4: Prominent disc narrowing with right predominant uncovertebral
ridging. Small dorsal disc osteophyte complex. Mild left and
moderate right foraminal stenosis.

C4-5: Degenerative disc narrowing with tiny central protrusion. Mild
posterior endplate ridging. Bilateral uncovertebral spurring with
mild foraminal narrowing symmetrically.

C5-6: Degenerative disc narrowing with bilateral uncovertebral
ridging. Posterior disc osteophyte complex. Moderate to advanced
bilateral foraminal stenosis. Patent spinal canal

C6-7: Annular fissure and mild disc bulging.  No impingement

C7-T1:Mild facet spurring on the left. No herniation or impingement.
IMPRESSION: 1. Disc degeneration from C3-4 to C5-6. Right more than left
uncovertebral spurring and foraminal impingement, especially at the
C3-4 and C5-6 levels.
2. Diffusely patent spinal canal.
3. 15 mm left thyroid nodule, at the size threshold for recommended
sonographic follow-up. Given young age, follow-up is recommended in
this case.

## 2018-02-25 DIAGNOSIS — M542 Cervicalgia: Secondary | ICD-10-CM | POA: Diagnosis not present

## 2018-02-25 DIAGNOSIS — G243 Spasmodic torticollis: Secondary | ICD-10-CM | POA: Diagnosis not present

## 2018-02-26 ENCOUNTER — Encounter: Payer: Self-pay | Admitting: Neurology

## 2018-02-26 DIAGNOSIS — N200 Calculus of kidney: Secondary | ICD-10-CM | POA: Diagnosis not present

## 2018-02-27 DIAGNOSIS — M542 Cervicalgia: Secondary | ICD-10-CM | POA: Diagnosis not present

## 2018-02-27 DIAGNOSIS — G243 Spasmodic torticollis: Secondary | ICD-10-CM | POA: Diagnosis not present

## 2018-03-04 DIAGNOSIS — G243 Spasmodic torticollis: Secondary | ICD-10-CM | POA: Diagnosis not present

## 2018-03-04 DIAGNOSIS — M542 Cervicalgia: Secondary | ICD-10-CM | POA: Diagnosis not present

## 2018-03-06 NOTE — Patient Instructions (Addendum)
Tracy May L May  03/06/2018   Your procedure is scheduled on: 03-13-18   Report to [facility] Main  Entrance    Report to Admitting at 9:00 AM    Call this number if you have problems the morning of surgery [phone]   Remember: Do not eat food or drink liquids :After Midnight.     Take these medicines the morning of surgery with A SIP OF WATER: None                                You may not have any metal on your body including hair pins and              piercings  Do not wear jewelry, make-up, lotions, powders or perfumes, deodorant             Do not wear nail polish.  Do not shave  48 hours prior to surgery.                Do not bring valuables to the hospital. [facility] IS NOT             RESPONSIBLE   FOR VALUABLES.  Contacts, dentures or bridgework may not be worn into surgery.       Patients discharged the day of surgery will not be allowed to drive home.  Name and phone number of your driver: Toya SmothersMatthew Hemme [phone]  Special Instructions: N/A              Please read over the following fact sheets you were given: _____________________________________________________________________          [facility] - Preparing for Surgery Before surgery, you can play an important role.  Because skin is not sterile, your skin needs to be as free of germs as possible.  You can reduce the number of germs on your skin by washing with CHG (chlorahexidine gluconate) soap before surgery.  CHG is an antiseptic cleaner which kills germs and bonds with the skin to continue killing germs even after washing. Please DO NOT use if you have an allergy to CHG or antibacterial soaps.  If your skin becomes reddened/irritated stop using the CHG and inform your nurse when you arrive at Short Stay. Do not shave (including legs and underarms) for at least 48 hours prior to the first CHG shower.  You may shave your face/neck. Please follow these instructions carefully:  1.   Shower with CHG Soap the night before surgery and the  morning of Surgery.  2.  If you choose to wash your hair, wash your hair first as usual with your  normal  shampoo.  3.  After you shampoo, rinse your hair and body thoroughly to remove the  shampoo.                           4.  Use CHG as you would any other liquid soap.  You can apply chg directly  to the skin and wash                       Gently with a scrungie or clean washcloth.  5.  Apply the CHG Soap to your body ONLY FROM THE NECK DOWN.   Do not use on face/ open  Wound or open sores. Avoid contact with eyes, ears mouth and genitals (private parts).                       Wash face,  Genitals (private parts) with your normal soap.             6.  Wash thoroughly, paying special attention to the area where your surgery  will be performed.  7.  Thoroughly rinse your body with warm water from the neck down.  8.  DO NOT shower/wash with your normal soap after using and rinsing off  the CHG Soap.                9.  Pat yourself dry with a clean towel.            10.  Wear clean pajamas.            11.  Place clean sheets on your bed the night of your first shower and do not  sleep with pets. Day of Surgery : Do not apply any lotions/deodorants the morning of surgery.  Please wear clean clothes to the hospital/surgery center.  FAILURE TO FOLLOW THESE INSTRUCTIONS MAY RESULT IN THE CANCELLATION OF YOUR SURGERY PATIENT SIGNATURE_________________________________  NURSE SIGNATURE__________________________________  ________________________________________________________________________

## 2018-03-07 ENCOUNTER — Other Ambulatory Visit: Payer: Self-pay

## 2018-03-07 ENCOUNTER — Encounter (HOSPITAL_COMMUNITY)
Admission: RE | Admit: 2018-03-07 | Discharge: 2018-03-07 | Disposition: A | Discharge status: Home / Self Care | Payer: 59 | Source: Ambulatory Visit | Attending: Surgery | Admitting: Surgery

## 2018-03-07 ENCOUNTER — Encounter (HOSPITAL_COMMUNITY): Payer: Self-pay

## 2018-03-07 DIAGNOSIS — K8050 Calculus of bile duct without cholangitis or cholecystitis without obstruction: Secondary | ICD-10-CM | POA: Insufficient documentation

## 2018-03-07 DIAGNOSIS — Z01818 Encounter for other preprocedural examination: Secondary | ICD-10-CM | POA: Diagnosis present

## 2018-03-07 HISTORY — DX: Nausea with vomiting, unspecified: Z98.890

## 2018-03-07 HISTORY — DX: Nontoxic single thyroid nodule: E04.1

## 2018-03-07 HISTORY — DX: Spasmodic torticollis: G24.3

## 2018-03-07 HISTORY — DX: Nausea with vomiting, unspecified: R11.2

## 2018-03-07 LAB — BASIC METABOLIC PANEL
Anion gap: 7 (ref 5–15)
BUN: 14 mg/dL (ref 6–20)
CO2: 30 mmol/L (ref 22–32)
Calcium: 9.3 mg/dL (ref 8.9–10.3)
Chloride: 103 mmol/L (ref 101–111)
Creatinine, Ser: 0.77 mg/dL (ref 0.44–1.00)
GFR calc non Af Amer: >60 mL/min (ref >60)
Glucose, Bld: 95 mg/dL (ref 65–99)
Potassium: 4.1 mmol/L (ref 3.5–5.1)
SODIUM: 140 mmol/L (ref 135–145)

## 2018-03-07 LAB — CBC
HCT: 39.4 % (ref 36.0–46.0)
Hemoglobin: 13.5 g/dL (ref 12.0–15.0)
MCH: 32 pg (ref 26.0–34.0)
MCHC: 34.3 g/dL (ref 30.0–36.0)
MCV: 93.4 fL (ref 78.0–100.0)
PLATELETS: 345 10*3/uL (ref 150–400)
RBC: 4.22 MIL/uL (ref 3.87–5.11)
RDW: 12.4 % (ref 11.5–15.5)
WBC: 6 10*3/uL (ref 4.0–10.5)

## 2018-03-08 ENCOUNTER — Inpatient Hospital Stay (HOSPITAL_COMMUNITY): Admission: RE | Admit: 2018-03-08 | Payer: 59 | Source: Ambulatory Visit

## 2018-03-12 ENCOUNTER — Other Ambulatory Visit: Payer: Self-pay | Admitting: *Deleted

## 2018-03-12 DIAGNOSIS — M542 Cervicalgia: Secondary | ICD-10-CM | POA: Diagnosis not present

## 2018-03-12 DIAGNOSIS — G243 Spasmodic torticollis: Secondary | ICD-10-CM | POA: Diagnosis not present

## 2018-03-12 MED ORDER — MECLIZINE HCL 12.5 MG PO TABS: 12.5000 mg | ORAL_TABLET | Freq: Every day | ORAL | 0 refills | Status: DC | PRN

## 2018-03-12 MED ORDER — BUPIVACAINE LIPOSOME 1.3 % IJ SUSP
20.0000 mL | Freq: Once | INTRAMUSCULAR | Status: DC
  Filled 2018-03-12: qty 20

## 2018-03-12 NOTE — Anesthesia Preprocedure Evaluation (Addendum)
Anesthesia Evaluation  Patient identified by MRN, date of birth, ID band Patient awake    Reviewed: Allergy & Precautions, H&P , NPO status , Patient's Chart, lab work & pertinent test results, reviewed documented beta blocker date and time   History of Anesthesia Complications (+) PONV and history of anesthetic complications  Airway Mallampati: II  TM Distance: >3 FB Neck ROM: full    Dental no notable dental hx.    Pulmonary asthma , former smoker,    Pulmonary exam normal breath sounds clear to auscultation       Cardiovascular Exercise Tolerance: Good  Rhythm:regular Rate:Normal     Neuro/Psych    GI/Hepatic GERD  Medicated,  Endo/Other    Renal/GU      Musculoskeletal   Abdominal   Peds  Hematology   Anesthesia Other Findings   Reproductive/Obstetrics                             Anesthesia Physical Anesthesia Plan  ASA: II  Anesthesia Plan: General   Post-op Pain Management:    Induction: Intravenous  PONV Risk Score and Plan: 3 and Ondansetron, Dexamethasone, Treatment may vary due to age or medical condition, Midazolam and Scopolamine patch - Pre-op  Airway Management Planned: Oral ETT  Additional Equipment:   Intra-op Plan:   Post-operative Plan: Extubation in OR  Informed Consent: I have reviewed the patients History and Physical, chart, labs and discussed the procedure including the risks, benefits and alternatives for the proposed anesthesia with the patient or authorized representative who has indicated his/her understanding and acceptance.   Dental Advisory Given  Plan Discussed with: CRNA  Anesthesia Plan Comments: (  )        Anesthesia Quick Evaluation

## 2018-03-13 ENCOUNTER — Ambulatory Visit (HOSPITAL_COMMUNITY)
Admission: RE | Admit: 2018-03-13 | Discharge: 2018-03-13 | Disposition: A | Discharge status: Home / Self Care | Payer: 59 | Source: Ambulatory Visit | Attending: Surgery | Admitting: Surgery

## 2018-03-13 ENCOUNTER — Ambulatory Visit (HOSPITAL_COMMUNITY): Payer: 59 | Admitting: Anesthesiology

## 2018-03-13 ENCOUNTER — Encounter (HOSPITAL_COMMUNITY)
Admission: RE | Disposition: A | Discharge status: Home / Self Care | Payer: Self-pay | Source: Ambulatory Visit | Attending: Surgery

## 2018-03-13 ENCOUNTER — Encounter (HOSPITAL_COMMUNITY): Payer: Self-pay | Admitting: General Practice

## 2018-03-13 DIAGNOSIS — K219 Gastro-esophageal reflux disease without esophagitis: Secondary | ICD-10-CM | POA: Insufficient documentation

## 2018-03-13 DIAGNOSIS — Z87891 Personal history of nicotine dependence: Secondary | ICD-10-CM | POA: Insufficient documentation

## 2018-03-13 DIAGNOSIS — Z79899 Other long term (current) drug therapy: Secondary | ICD-10-CM | POA: Insufficient documentation

## 2018-03-13 DIAGNOSIS — Z791 Long term (current) use of non-steroidal anti-inflammatories (NSAID): Secondary | ICD-10-CM | POA: Diagnosis not present

## 2018-03-13 DIAGNOSIS — K8010 Calculus of gallbladder with chronic cholecystitis without obstruction: Secondary | ICD-10-CM | POA: Diagnosis not present

## 2018-03-13 DIAGNOSIS — K8050 Calculus of bile duct without cholangitis or cholecystitis without obstruction: Secondary | ICD-10-CM | POA: Diagnosis not present

## 2018-03-13 HISTORY — PX: CHOLECYSTECTOMY: SHX55

## 2018-03-13 SURGERY — LAPAROSCOPIC CHOLECYSTECTOMY
Anesthesia: General | Site: Abdomen

## 2018-03-13 MED ORDER — SODIUM CHLORIDE 0.9% FLUSH: 3.0000 mL | INTRAVENOUS | Status: DC | PRN

## 2018-03-13 MED ORDER — LIDOCAINE 2% (20 MG/ML) 5 ML SYRINGE
INTRAMUSCULAR | Status: DC | PRN
  Administered 2018-03-13: 100 mg via INTRAVENOUS

## 2018-03-13 MED ORDER — ACETAMINOPHEN 500 MG PO TABS
1000.0000 mg | ORAL_TABLET | ORAL | Status: AC
  Administered 2018-03-13: 1000 mg via ORAL

## 2018-03-13 MED ORDER — OXYCODONE HCL 5 MG PO TABS: 5.0000 mg | ORAL_TABLET | ORAL | Status: DC | PRN

## 2018-03-13 MED ORDER — ACETAMINOPHEN 160 MG/5ML PO SOLN: 325.0000 mg | ORAL | Status: DC | PRN

## 2018-03-13 MED ORDER — DEXAMETHASONE SODIUM PHOSPHATE 10 MG/ML IJ SOLN
INTRAMUSCULAR | Status: AC
  Filled 2018-03-13: qty 1

## 2018-03-13 MED ORDER — ROCURONIUM BROMIDE 10 MG/ML (PF) SYRINGE
PREFILLED_SYRINGE | INTRAVENOUS | Status: DC | PRN
  Administered 2018-03-13: 40 mg via INTRAVENOUS

## 2018-03-13 MED ORDER — SUGAMMADEX SODIUM 200 MG/2ML IV SOLN
INTRAVENOUS | Status: DC | PRN
  Administered 2018-03-13: 130 mg via INTRAVENOUS

## 2018-03-13 MED ORDER — FENTANYL CITRATE (PF) 100 MCG/2ML IJ SOLN
INTRAMUSCULAR | Status: AC
  Administered 2018-03-13: 50 ug via INTRAVENOUS
  Filled 2018-03-13: qty 2

## 2018-03-13 MED ORDER — MIDAZOLAM HCL 2 MG/2ML IJ SOLN
INTRAMUSCULAR | Status: AC
  Filled 2018-03-13: qty 2

## 2018-03-13 MED ORDER — DEXAMETHASONE SODIUM PHOSPHATE 10 MG/ML IJ SOLN
INTRAMUSCULAR | Status: DC | PRN
  Administered 2018-03-13: 10 mg via INTRAVENOUS

## 2018-03-13 MED ORDER — GABAPENTIN 300 MG PO CAPS
ORAL_CAPSULE | ORAL | Status: AC
  Administered 2018-03-13: 300 mg via ORAL
  Filled 2018-03-13: qty 1

## 2018-03-13 MED ORDER — CHLORHEXIDINE GLUCONATE 4 % EX LIQD: 60.0000 mL | Freq: Once | CUTANEOUS | Status: DC

## 2018-03-13 MED ORDER — LACTATED RINGERS IV SOLN
INTRAVENOUS | Status: DC | PRN
  Administered 2018-03-13: 10:00:00 via INTRAVENOUS

## 2018-03-13 MED ORDER — ONDANSETRON HCL 4 MG/2ML IJ SOLN
INTRAMUSCULAR | Status: DC | PRN
  Administered 2018-03-13: 4 mg via INTRAVENOUS

## 2018-03-13 MED ORDER — SODIUM CHLORIDE 0.9% FLUSH: 3.0000 mL | Freq: Two times a day (BID) | INTRAVENOUS | Status: DC

## 2018-03-13 MED ORDER — EPHEDRINE SULFATE-NACL 50-0.9 MG/10ML-% IV SOSY
PREFILLED_SYRINGE | INTRAVENOUS | Status: DC | PRN
  Administered 2018-03-13 (×2): 5 mg via INTRAVENOUS

## 2018-03-13 MED ORDER — LIDOCAINE 2% (20 MG/ML) 5 ML SYRINGE
INTRAMUSCULAR | Status: AC
  Filled 2018-03-13: qty 5

## 2018-03-13 MED ORDER — BUPIVACAINE-EPINEPHRINE 0.25% -1:200000 IJ SOLN
INTRAMUSCULAR | Status: DC | PRN
  Administered 2018-03-13: 30 mL

## 2018-03-13 MED ORDER — SCOPOLAMINE 1 MG/3DAYS TD PT72
MEDICATED_PATCH | TRANSDERMAL | Status: AC
  Filled 2018-03-13: qty 1

## 2018-03-13 MED ORDER — PROPOFOL 10 MG/ML IV BOLUS
INTRAVENOUS | Status: DC | PRN
  Administered 2018-03-13: 200 mg via INTRAVENOUS

## 2018-03-13 MED ORDER — FENTANYL CITRATE (PF) 250 MCG/5ML IJ SOLN
INTRAMUSCULAR | Status: DC | PRN
  Administered 2018-03-13: 100 ug via INTRAVENOUS

## 2018-03-13 MED ORDER — FENTANYL CITRATE (PF) 100 MCG/2ML IJ SOLN
25.0000 ug | INTRAMUSCULAR | Status: DC | PRN
  Administered 2018-03-13 (×2): 50 ug via INTRAVENOUS

## 2018-03-13 MED ORDER — MEPERIDINE HCL 50 MG/ML IJ SOLN: 6.2500 mg | INTRAMUSCULAR | Status: DC | PRN

## 2018-03-13 MED ORDER — HYDROCODONE-ACETAMINOPHEN 5-325 MG PO TABS: 1.0000 | ORAL_TABLET | Freq: Four times a day (QID) | ORAL | 0 refills | Status: DC | PRN

## 2018-03-13 MED ORDER — ACETAMINOPHEN 500 MG PO TABS
ORAL_TABLET | ORAL | Status: AC
  Administered 2018-03-13: 1000 mg via ORAL
  Filled 2018-03-13: qty 2

## 2018-03-13 MED ORDER — FENTANYL CITRATE (PF) 250 MCG/5ML IJ SOLN
INTRAMUSCULAR | Status: AC
  Filled 2018-03-13: qty 5

## 2018-03-13 MED ORDER — 0.9 % SODIUM CHLORIDE (POUR BTL) OPTIME
TOPICAL | Status: DC | PRN
  Administered 2018-03-13: 1000 mL

## 2018-03-13 MED ORDER — ACETAMINOPHEN 650 MG RE SUPP: 650.0000 mg | RECTAL | Status: DC | PRN

## 2018-03-13 MED ORDER — DOCUSATE SODIUM 100 MG PO CAPS: 100.0000 mg | ORAL_CAPSULE | Freq: Two times a day (BID) | ORAL | 0 refills | Status: AC

## 2018-03-13 MED ORDER — LACTATED RINGERS IR SOLN
Status: DC | PRN
  Administered 2018-03-13: 1000 mL

## 2018-03-13 MED ORDER — OXYCODONE HCL 5 MG/5ML PO SOLN
5.0000 mg | Freq: Once | ORAL | Status: DC | PRN
  Filled 2018-03-13: qty 5

## 2018-03-13 MED ORDER — PROPOFOL 10 MG/ML IV BOLUS
INTRAVENOUS | Status: AC
  Filled 2018-03-13: qty 20

## 2018-03-13 MED ORDER — ACETAMINOPHEN 325 MG PO TABS: 650.0000 mg | ORAL_TABLET | ORAL | Status: DC | PRN

## 2018-03-13 MED ORDER — LACTATED RINGERS IV SOLN
Freq: Once | INTRAVENOUS | Status: AC
  Administered 2018-03-13: 10:00:00 via INTRAVENOUS

## 2018-03-13 MED ORDER — EPHEDRINE 5 MG/ML INJ
INTRAVENOUS | Status: AC
  Filled 2018-03-13: qty 10

## 2018-03-13 MED ORDER — OXYCODONE HCL 5 MG PO TABS: 5.0000 mg | ORAL_TABLET | Freq: Once | ORAL | Status: DC | PRN

## 2018-03-13 MED ORDER — FENTANYL CITRATE (PF) 100 MCG/2ML IJ SOLN: 25.0000 ug | INTRAMUSCULAR | Status: DC | PRN

## 2018-03-13 MED ORDER — BUPIVACAINE-EPINEPHRINE (PF) 0.25% -1:200000 IJ SOLN
INTRAMUSCULAR | Status: AC
  Filled 2018-03-13: qty 30

## 2018-03-13 MED ORDER — CEFAZOLIN SODIUM-DEXTROSE 2-4 GM/100ML-% IV SOLN
2.0000 g | INTRAVENOUS | Status: AC
  Administered 2018-03-13: 2 g via INTRAVENOUS

## 2018-03-13 MED ORDER — ACETAMINOPHEN 325 MG PO TABS: 325.0000 mg | ORAL_TABLET | ORAL | Status: DC | PRN

## 2018-03-13 MED ORDER — GABAPENTIN 300 MG PO CAPS
300.0000 mg | ORAL_CAPSULE | ORAL | Status: AC
  Administered 2018-03-13: 300 mg via ORAL

## 2018-03-13 MED ORDER — ROCURONIUM BROMIDE 100 MG/10ML IV SOLN
INTRAVENOUS | Status: AC
  Filled 2018-03-13: qty 1

## 2018-03-13 MED ORDER — SUGAMMADEX SODIUM 200 MG/2ML IV SOLN
INTRAVENOUS | Status: AC
  Filled 2018-03-13: qty 2

## 2018-03-13 MED ORDER — SODIUM CHLORIDE 0.9 % IV SOLN
250.0000 mL | INTRAVENOUS | Status: DC | PRN
Start: 2018-03-13 — End: 2018-03-13

## 2018-03-13 MED ORDER — ONDANSETRON HCL 4 MG/2ML IJ SOLN
INTRAMUSCULAR | Status: AC
  Filled 2018-03-13: qty 2

## 2018-03-13 MED ORDER — SCOPOLAMINE 1 MG/3DAYS TD PT72
1.0000 | MEDICATED_PATCH | TRANSDERMAL | Status: DC
  Administered 2018-03-13: 1.5 mg via TRANSDERMAL

## 2018-03-13 MED ORDER — ONDANSETRON HCL 4 MG/2ML IJ SOLN: 4.0000 mg | Freq: Once | INTRAMUSCULAR | Status: DC | PRN

## 2018-03-13 MED ORDER — MIDAZOLAM HCL 2 MG/2ML IJ SOLN
INTRAMUSCULAR | Status: DC | PRN
  Administered 2018-03-13: 2 mg via INTRAVENOUS

## 2018-03-13 MED ORDER — CEFAZOLIN SODIUM-DEXTROSE 2-4 GM/100ML-% IV SOLN
INTRAVENOUS | Status: AC
  Filled 2018-03-13: qty 100

## 2018-03-13 SURGICAL SUPPLY — 36 items
ADH SKN CLS APL DERMABOND .7 (GAUZE/BANDAGES/DRESSINGS) ×1
APPLIER CLIP ROT 10 11.4 M/L (STAPLE) ×3
APR CLP MED LRG 11.4X10 (STAPLE) ×1
BAG SPEC RTRVL LRG 6X4 10 (ENDOMECHANICALS) ×1
CABLE HIGH FREQUENCY MONO STRZ (ELECTRODE) ×3 IMPLANT
CHLORAPREP W/TINT 26ML (MISCELLANEOUS) ×3 IMPLANT
CLIP APPLIE ROT 10 11.4 M/L (STAPLE) ×1 IMPLANT
COVER MAYO STAND STRL (DRAPES) IMPLANT
COVER SURGICAL LIGHT HANDLE (MISCELLANEOUS) ×3 IMPLANT
DECANTER SPIKE VIAL GLASS SM (MISCELLANEOUS) ×3 IMPLANT
DERMABOND ADVANCED (GAUZE/BANDAGES/DRESSINGS) ×2
DERMABOND ADVANCED .7 DNX12 (GAUZE/BANDAGES/DRESSINGS) ×1 IMPLANT
DRAPE C-ARM 42X120 X-RAY (DRAPES) IMPLANT
DRAPE LAPAROSCOPIC ABDOMINAL (DRAPES) ×2 IMPLANT
ELECT REM PT RETURN 15FT ADLT (MISCELLANEOUS) ×3 IMPLANT
GLOVE BIO SURGEON STRL SZ 6 (GLOVE) ×3 IMPLANT
GLOVE INDICATOR 6.5 STRL GRN (GLOVE) ×3 IMPLANT
GOWN STRL REUS W/TWL LRG LVL3 (GOWN DISPOSABLE) ×3 IMPLANT
GOWN STRL REUS W/TWL XL LVL3 (GOWN DISPOSABLE) ×6 IMPLANT
GRASPER SUT TROCAR 14GX15 (MISCELLANEOUS) ×3 IMPLANT
HEMOSTAT SNOW SURGICEL 2X4 (HEMOSTASIS) IMPLANT
KIT BASIN OR (CUSTOM PROCEDURE TRAY) ×3 IMPLANT
NDL INSUFFLATION 14GA 120MM (NEEDLE) ×1 IMPLANT
NEEDLE INSUFFLATION 14GA 120MM (NEEDLE) ×3 IMPLANT
POUCH SPECIMEN RETRIEVAL 10MM (ENDOMECHANICALS) ×3 IMPLANT
SCISSORS LAP 5X35 DISP (ENDOMECHANICALS) ×3 IMPLANT
SET CHOLANGIOGRAPH MIX (MISCELLANEOUS) IMPLANT
SET IRRIG TUBING LAPAROSCOPIC (IRRIGATION / IRRIGATOR) ×3 IMPLANT
SLEEVE XCEL OPT CAN 5 100 (ENDOMECHANICALS) ×6 IMPLANT
SUT MNCRL AB 4-0 PS2 18 (SUTURE) ×3 IMPLANT
TOWEL OR 17X26 10 PK STRL BLUE (TOWEL DISPOSABLE) ×3 IMPLANT
TOWEL OR NON WOVEN STRL DISP B (DISPOSABLE) IMPLANT
TRAY LAPAROSCOPIC (CUSTOM PROCEDURE TRAY) ×3 IMPLANT
TROCAR BLADELESS OPT 5 100 (ENDOMECHANICALS) ×3 IMPLANT
TROCAR XCEL 12X100 BLDLESS (ENDOMECHANICALS) ×3 IMPLANT
TUBING INSUF HEATED (TUBING) ×3 IMPLANT

## 2018-03-13 NOTE — Interval H&P Note (Signed)
History and Physical Interval Note:  03/13/2018 10:04 AM  Tracy May  has presented today for surgery, with the diagnosis of BILIARY COLIC  The various methods of treatment have been discussed with the patient and family. After consideration of risks, benefits and other options for treatment, the patient has consented to  Procedure(s): LAPAROSCOPIC CHOLECYSTECTOMY (N/A) as a surgical intervention .  The patient's history has been reviewed, patient examined, no change in status, stable for surgery.  I have reviewed the patient's chart and labs.  Questions were answered to the patient's satisfaction.     Jmichael Gille Lollie SailsA Jose Corvin

## 2018-03-13 NOTE — Transfer of Care (Signed)
Immediate Anesthesia Transfer of Care Note  Patient: Tracy May  Procedure(s) Performed: LAPAROSCOPIC CHOLECYSTECTOMY (N/A Abdomen)  Patient Location: PACU  Anesthesia Type:General  Level of Consciousness: awake  Airway & Oxygen Therapy: Patient Spontanous Breathing and Patient connected to face mask oxygen  Post-op Assessment: Report given to RN and Post -op Vital signs reviewed and stable  Post vital signs: Reviewed and stable  Last Vitals:  Vitals Value Taken Time  BP    Temp    Pulse 95 03/13/2018 11:14 AM  Resp 22 03/13/2018 11:14 AM  SpO2 100 % 03/13/2018 11:14 AM  Vitals shown include unvalidated device data.  Last Pain:  Vitals:   03/13/18 0920  TempSrc:   PainSc: 1          Complications: No apparent anesthesia complications

## 2018-03-13 NOTE — Op Note (Signed)
Operative Note  Tracy BoyerRebecca L May 62 y.o. female [id number]  03/13/2018  Surgeon: Berna Buehelsea A Graeson Nouri MD  Assistant: none  Procedure performed: Laparoscopic Cholecystectomy  Preop diagnosis: biliary colic Post-op diagnosis/intraop findings: same  Specimens: gallbladder  EBL: minimal  Complications: none  Description of procedure: After obtaining informed consent the patient was brought to the operating room. Prophylactic antibiotics were administered. SCD's were applied. General endotracheal anesthesia was initiated and a formal time-out was performed. The abdomen was prepped and draped in the usual sterile fashion and the abdomen was entered using visiport technique in the left upper quadrant after instilling the site with local. Insufflation to 15mmHg was obtained and gross inspection revealed no evidence of injury from our entry or other intraabdominal abnormalities. Three 5mm trocars were introduced in the infraumbilical, right midclavicular and right anterior axillary lines under direct visualization and following infiltration with local. Entry port was upsized to 11mm trocar. The gallbladder was retracted cephalad and the infundibulum was retracted laterally. Omental adhesions to the gallbladder were taken down bluntly and with cautery where indicated. A combination of hook electrocautery and blunt dissection was utilized to clear the peritoneum from the neck and cystic duct, circumferentially isolating the cystic artery and cystic duct and lifting the gallbladder from the cystic plate. The critical view of safety was achieved with the cystic artery, cystic duct, and liver bed visualized between them with no other structures. The artery was clipped with a two clips proximally and distally and divided as was the cystic duct with four clips on the proximal end. The gallbladder was dissected from the liver plate using electrocautery. Once freed the gallbladder was placed in an endocatch bag and  removed intact through the epigastric trocar site. Hemostasis was once again confirmed, and reinspection of the abdomen revealed no injuries. The clips were well opposed without any bile leak from the duct or the liver bed. The 11mm trocar site in the epigastrium was closed with a 0 vicryl in the fascia under direct visualization using a PMI device. The abdomen was desufflated and all trocars removed. The skin incisions were closed with running subcuticular monocryl and Dermabond. The patient was awakened, extubated and transported to the recovery room in stable condition.   All counts were correct at the completion of the case.

## 2018-03-13 NOTE — Anesthesia Procedure Notes (Signed)
Procedure Name: Intubation Date/Time: 03/13/2018 10:15 AM Performed by: Florene Routeeardon, Jillyan Plitt L, CRNA Patient Re-evaluated:Patient Re-evaluated prior to induction Oxygen Delivery Method: Circle system utilized Preoxygenation: Pre-oxygenation with 100% oxygen Induction Type: IV induction Ventilation: Mask ventilation without difficulty Laryngoscope Size: Miller and 2 Grade View: Grade I Tube type: Oral Tube size: 7.0 mm Number of attempts: 1 Airway Equipment and Method: Stylet Placement Confirmation: ETT inserted through vocal cords under direct vision,  positive ETCO2 and breath sounds checked- equal and bilateral Secured at: 22 cm Tube secured with: Tape Dental Injury: Teeth and Oropharynx as per pre-operative assessment

## 2018-03-13 NOTE — Discharge Instructions (Signed)
LAPAROSCOPIC SURGERY: POST OP INSTRUCTIONS  ######################################################################  EAT Gradually transition to a high fiber diet with a fiber supplement over the next few weeks after discharge.  Start with a pureed / full liquid diet (see below)  WALK Walk an hour a day.  Control your pain to do that.    CONTROL PAIN Control pain so that you can walk, sleep, tolerate sneezing/coughing, go up/down stairs.  HAVE A BOWEL MOVEMENT DAILY Keep your bowels regular to avoid problems.  OK to try a laxative to override constipation.  OK to use an antidairrheal to slow down diarrhea.  Call if not better after 2 tries  CALL IF YOU HAVE PROBLEMS/CONCERNS Call if you are still struggling despite following these instructions. Call if you have concerns not answered by these instructions  ######################################################################    1. DIET: Follow a light bland diet the first 24 hours after arrival home, such as soup, liquids, crackers, etc.  Be sure to include lots of fluids daily.  Avoid fast food or heavy meals as your are more likely to get nauseated.  Eat a low fat the next few days after surgery.   2. Take your usually prescribed home medications unless otherwise directed. 3. PAIN CONTROL: a. Pain is best controlled by a usual combination of three different methods TOGETHER: i. Ice/Heat ii. Over the counter pain medication iii. Prescription pain medication b. Most patients will experience some swelling and bruising around the incisions.  Ice packs or heating pads (30-60 minutes up to 6 times a day) will help. Use ice for the first few days to help decrease swelling and bruising, then switch to heat to help relax tight/sore spots and speed recovery.  Some people prefer to use ice alone, heat alone, alternating between ice & heat.  Experiment to what works for you.  Swelling and bruising can take several weeks to resolve.   c. It is  helpful to take an over-the-counter pain medication regularly for the first few weeks.  Choose one of the following that works best for you: i. Naproxen (Aleve, etc)  Two 251m tabs twice a day ii. Ibuprofen (Advil, etc) Three 2053mtabs four times a day (every meal & bedtime) iii. Acetaminophen (Tylenol, etc) 500-65044mour times a day (every meal & bedtime) d. A  prescription for pain medication (such as oxycodone, hydrocodone, etc) should be given to you upon discharge.  Take your pain medication as prescribed.  i. If you are having problems/concerns with the prescription medicine (does not control pain, nausea, vomiting, rash, itching, etc), please call us [phone] see if we need to switch you to a different pain medicine that will work better for you and/or control your side effect better. ii. If you need a refill on your pain medication, please contact your pharmacy.  They will contact our office to request authorization. Prescriptions will not be filled after 5 pm or on week-ends. 4. Avoid getting constipated.  Between the surgery and the pain medications, it is common to experience some constipation.  Increasing fluid intake and taking a fiber supplement (such as Metamucil, Citrucel, FiberCon, MiraLax, etc) 1-2 times a day regularly will usually help prevent this problem from occurring.  A mild laxative (prune juice, Milk of Magnesia, MiraLax, etc) should be taken according to package directions if there are no bowel movements after 48 hours.   5. Watch out for diarrhea.  If you have many loose bowel movements, simplify your diet to bland foods & liquids for  a few days.  Stop any stool softeners and decrease your fiber supplement.  Switching to mild anti-diarrheal medications (Kayopectate, Pepto Bismol) can help.  If this worsens or does not improve, please call us. 6. Wash / shower every day.  You may shower over skin glue which is waterproof.  Continue to shower over incision(s) after the  dressing is off. 7. Skin glue will flake off after about 2 weeks.  You may leave the incision open to air.  You may replace a dressing/Band-Aid to cover the incision for comfort if you wish.  8. ACTIVITIES as tolerated:   a. You may resume regular (light) daily activities beginning the next day--such as daily self-care, walking, climbing stairs--gradually increasing activities as tolerated.  If you can walk 30 minutes without difficulty, it is safe to try more intense activity such as jogging, treadmill, bicycling, low-impact aerobics, swimming, etc. b. Save the most intensive and strenuous activity for last such as sit-ups, heavy lifting, contact sports, etc  Refrain from any heavy lifting or straining until you are off narcotics for pain control.   c. DO NOT PUSH THROUGH PAIN.  Let pain be your guide: If it hurts to do something, don't do it.  Pain is your body warning you to avoid that activity for another week until the pain goes down. d. You may drive when you are no longer taking prescription pain medication, you can comfortably wear a seatbelt, and you can safely maneuver your car and apply brakes. e. Bonita QuinYou may have sexual intercourse when it is comfortable.  9. FOLLOW UP in our office a. Please call [facility] at [phone] to set up an appointment to see your surgeon in the office for a follow-up appointment approximately 2-3 weeks after your surgery. b. Make sure that you call for this appointment the day you arrive home to insure a convenient appointment time. 10. IF YOU HAVE DISABILITY OR FAMILY LEAVE FORMS, BRING THEM TO THE OFFICE FOR PROCESSING.  DO NOT GIVE THEM TO YOUR DOCTOR.   WHEN TO CALL US [phone]: 1. Poor pain control 2. Reactions / problems with new medications (rash/itching, nausea, etc)  3. Fever over 101.5 F (38.5 C) 4. Inability to urinate 5. Nausea and/or vomiting 6. Worsening swelling or bruising 7. Continued bleeding from incision. 8. Increased pain, redness, or  drainage from the incision   The clinic staff is available to answer your questions during regular business hours (8:30am-5pm).  Please dont hesitate to call and ask to speak to one of our nurses for clinical concerns.   If you have a medical emergency, go to the nearest emergency room or call 911.  A surgeon from [facility] is always on call at the [facility]   [facility], [place] [address], [address], [place], [place]  [address] ? MAIN: [phone] ? TOLL FREE: [phone] ?  FAX [phone] [email or web address]

## 2018-03-14 ENCOUNTER — Encounter (HOSPITAL_COMMUNITY): Payer: Self-pay | Admitting: Surgery

## 2018-03-14 NOTE — Anesthesia Postprocedure Evaluation (Signed)
Anesthesia Post Note  Patient: Tracy May  Procedure(s) Performed: LAPAROSCOPIC CHOLECYSTECTOMY (N/A Abdomen)     Patient location during evaluation: PACU Anesthesia Type: General Level of consciousness: awake and alert Pain management: pain level controlled Vital Signs Assessment: post-procedure vital signs reviewed and stable Respiratory status: spontaneous breathing, nonlabored ventilation, respiratory function stable and patient connected to nasal cannula oxygen Cardiovascular status: blood pressure returned to baseline and stable Postop Assessment: no apparent nausea or vomiting Anesthetic complications: no    Last Vitals:  Vitals:   03/13/18 1210 03/13/18 1240  BP: 111/84 118/80  Pulse: 63 65  Resp: 15 18  Temp: (!) 36.4 C 36.4 C  SpO2: 95% 98%    Last Pain:  Vitals:   03/13/18 1240  TempSrc:   PainSc: 2                  Kolbey Teichert

## 2018-03-21 ENCOUNTER — Encounter: Payer: Self-pay | Admitting: Internal Medicine

## 2018-03-21 ENCOUNTER — Ambulatory Visit (INDEPENDENT_AMBULATORY_CARE_PROVIDER_SITE_OTHER): Payer: 59 | Admitting: Internal Medicine

## 2018-03-21 DIAGNOSIS — Z0000 Encounter for general adult medical examination without abnormal findings: Secondary | ICD-10-CM

## 2018-03-21 NOTE — Assessment & Plan Note (Signed)
Colon due at 62 yo - she will scedule We discussed age appropriate health related issues, including available/recomended screening tests and vaccinations. We discussed a need for adhering to healthy diet and exercise. Labs/EKG were reviewed/ordered. All questions were answered.

## 2018-03-21 NOTE — Patient Instructions (Signed)
Cologuard test

## 2018-03-21 NOTE — Progress Notes (Signed)
Subjective:  Patient ID: Tracy May, female    DOB: 1956-09-06  Age: 62 y.o. MRN: [id number]  CC: No chief complaint on file.   HPI Tracy May presents for a well exam  Outpatient Medications Prior to Visit  Medication Sig Dispense Refill  . acetaminophen (APAP EXTRA STRENGTH) 500 MG tablet Take 1,000 mg by mouth every 6 (six) hours as needed for moderate pain or headache.     . Azelaic Acid (FINACEA) 15 % cream Apply 1 application topically 2 (two) times daily as needed (for acne). After skin is thoroughly washed and patted dry, gently but thoroughly massage a thin film of azelaic acid cream into the affected area twice daily, in the morning and evening.     Marland Kitchen. b complex vitamins tablet Take 1 tablet by mouth daily. 100 tablet 3  . Biotin (BIOTIN MAXIMUM STRENGTH) 5 MG CAPS Take 5 capsules by mouth every Monday, Wednesday, and Friday.     . cetirizine (ZYRTEC) 10 MG tablet Take 10 mg by mouth daily as needed for allergies.     . Cholecalciferol 2000 UNITS TABS Take 2,000 Units by mouth daily.     Marland Kitchen. docusate sodium (COLACE) 100 MG capsule Take 1 capsule (100 mg total) by mouth 2 (two) times daily. 60 capsule 0  . estradiol (MINIVELLE) 0.05 MG/24HR patch Place 1 patch onto the skin 2 (two) times a week.    Marland Kitchen. HYDROcodone-acetaminophen (NORCO/VICODIN) 5-325 MG tablet Take 1 tablet by mouth every 6 (six) hours as needed for moderate pain. 28 tablet 0  . Magnesium 250 MG TABS Take 250 mg by mouth daily.     . meclizine (ANTIVERT) 12.5 MG tablet Take 1 tablet (12.5 mg total) by mouth daily as needed for dizziness. 30 tablet 0  . meloxicam (MOBIC) 15 MG tablet TAKE 1 TABLET(15 MG) BY MOUTH DAILY AS NEEDED FOR PAIN 90 tablet 1  . tretinoin (RETIN-A) 0.025 % cream Apply 1 application topically at bedtime as needed (for acne).    . zolpidem (AMBIEN) 10 MG tablet Take 1 tablet (10 mg total) by mouth at bedtime as needed. for sleep (Patient taking differently: Take 10 mg by mouth at bedtime. ) 30  tablet 3   No facility-administered medications prior to visit.     ROS: Review of Systems  Constitutional: Negative for activity change, appetite change, chills, fatigue and unexpected weight change.  HENT: Negative for congestion, mouth sores and sinus pressure.   Eyes: Negative for visual disturbance.  Respiratory: Negative for cough and chest tightness.   Gastrointestinal: Negative for abdominal pain and nausea.  Genitourinary: Negative for difficulty urinating, frequency and vaginal pain.  Musculoskeletal: Negative for back pain and gait problem.  Skin: Negative for pallor and rash.  Neurological: Negative for dizziness, tremors, weakness, numbness and headaches.  Psychiatric/Behavioral: Negative for confusion, sleep disturbance and suicidal ideas.    Objective:  BP 128/72 (BP Location: Left Arm, Patient Position: Sitting, Cuff Size: Normal)   Pulse (!) 51   Temp 98.5 F (36.9 C) (Oral)   Ht 5\' 4"  (1.626 m)   Wt 134 lb (60.8 kg)   SpO2 98%   BMI 23.00 kg/m   BP Readings from Last 3 Encounters:  03/21/18 128/72  03/13/18 118/80  03/07/18 138/87    Wt Readings from Last 3 Encounters:  03/21/18 134 lb (60.8 kg)  03/13/18 132 lb 6 oz (60 kg)  03/07/18 132 lb 6 oz (60 kg)    Physical Exam  Constitutional: She appears well-developed. No distress.  HENT:  Head: Normocephalic.  Right Ear: External ear normal.  Left Ear: External ear normal.  Nose: Nose normal.  Mouth/Throat: Oropharynx is clear and moist.  Eyes: Pupils are equal, round, and reactive to light. Conjunctivae are normal. Right eye exhibits no discharge. Left eye exhibits no discharge.  Neck: Normal range of motion. Neck supple. No JVD present. No tracheal deviation present. No thyromegaly present.  Cardiovascular: Normal rate, regular rhythm and normal heart sounds.  Pulmonary/Chest: No stridor. No respiratory distress. She has no wheezes.  Abdominal: Soft. Bowel sounds are normal. She exhibits no  distension and no mass. There is no tenderness. There is no rebound and no guarding.  Musculoskeletal: She exhibits no edema or tenderness.  Lymphadenopathy:    She has no cervical adenopathy.  Neurological: She displays normal reflexes. No cranial nerve deficit. She exhibits normal muscle tone. Coordination normal.  Skin: No rash noted. No erythema.  Psychiatric: She has a normal mood and affect. Her behavior is normal. Judgment and thought content normal.    Lab Results  Component Value Date   WBC 6.0 03/07/2018   HGB 13.5 03/07/2018   HCT 39.4 03/07/2018   PLT 345 03/07/2018   GLUCOSE 95 03/07/2018   CHOL 208 (H) 02/01/2018   TRIG 30.0 02/01/2018   HDL 76.00 02/01/2018   LDLDIRECT 102.3 10/16/2012   LDLCALC 126 (H) 02/01/2018   ALT 16 02/01/2018   AST 14 02/01/2018   NA 140 03/07/2018   K 4.1 03/07/2018   CL 103 03/07/2018   CREATININE 0.77 03/07/2018   BUN 14 03/07/2018   CO2 30 03/07/2018   TSH 0.92 02/01/2018    No results found.  Assessment & Plan:   There are no diagnoses linked to this encounter.   No orders of the defined types were placed in this encounter.    Follow-up: No follow-ups on file.  Sonda Primes, MD

## 2018-04-01 DIAGNOSIS — M542 Cervicalgia: Secondary | ICD-10-CM | POA: Diagnosis not present

## 2018-04-01 DIAGNOSIS — G243 Spasmodic torticollis: Secondary | ICD-10-CM | POA: Diagnosis not present

## 2018-04-03 DIAGNOSIS — M542 Cervicalgia: Secondary | ICD-10-CM | POA: Diagnosis not present

## 2018-04-03 DIAGNOSIS — G243 Spasmodic torticollis: Secondary | ICD-10-CM | POA: Diagnosis not present

## 2018-04-04 DIAGNOSIS — H40003 Preglaucoma, unspecified, bilateral: Secondary | ICD-10-CM | POA: Diagnosis not present

## 2018-04-08 DIAGNOSIS — M542 Cervicalgia: Secondary | ICD-10-CM | POA: Diagnosis not present

## 2018-04-08 DIAGNOSIS — G243 Spasmodic torticollis: Secondary | ICD-10-CM | POA: Diagnosis not present

## 2018-04-10 DIAGNOSIS — G243 Spasmodic torticollis: Secondary | ICD-10-CM | POA: Diagnosis not present

## 2018-04-10 DIAGNOSIS — M542 Cervicalgia: Secondary | ICD-10-CM | POA: Diagnosis not present

## 2018-04-15 DIAGNOSIS — M542 Cervicalgia: Secondary | ICD-10-CM | POA: Diagnosis not present

## 2018-04-15 DIAGNOSIS — G243 Spasmodic torticollis: Secondary | ICD-10-CM | POA: Diagnosis not present

## 2018-04-17 DIAGNOSIS — M542 Cervicalgia: Secondary | ICD-10-CM | POA: Diagnosis not present

## 2018-04-17 DIAGNOSIS — G243 Spasmodic torticollis: Secondary | ICD-10-CM | POA: Diagnosis not present

## 2018-04-22 DIAGNOSIS — Z1211 Encounter for screening for malignant neoplasm of colon: Secondary | ICD-10-CM | POA: Diagnosis not present

## 2018-04-22 LAB — HM COLONOSCOPY

## 2018-04-24 DIAGNOSIS — G243 Spasmodic torticollis: Secondary | ICD-10-CM | POA: Diagnosis not present

## 2018-04-24 DIAGNOSIS — M542 Cervicalgia: Secondary | ICD-10-CM | POA: Diagnosis not present

## 2018-04-26 ENCOUNTER — Encounter: Payer: Self-pay | Admitting: Neurology

## 2018-05-01 ENCOUNTER — Encounter (INDEPENDENT_AMBULATORY_CARE_PROVIDER_SITE_OTHER): Payer: Self-pay

## 2018-05-06 DIAGNOSIS — R300 Dysuria: Secondary | ICD-10-CM | POA: Diagnosis not present

## 2018-05-07 ENCOUNTER — Telehealth: Payer: Self-pay | Admitting: Neurology

## 2018-05-07 NOTE — Telephone Encounter (Signed)
Called and changed to urgent appeal. Reference number [id number].

## 2018-05-07 NOTE — Telephone Encounter (Signed)
Caryn BeeKevin from [employer] states injection was denied but if Dr.Tat sends an urgent appeal, then the turn around time is a lot faster for patient. If Dr.Tat has questions, provider line is [phone] and reference [id number]. FYI

## 2018-05-09 ENCOUNTER — Encounter: Payer: Self-pay | Admitting: Internal Medicine

## 2018-05-10 ENCOUNTER — Telehealth: Payer: Self-pay | Admitting: Neurology

## 2018-05-10 ENCOUNTER — Ambulatory Visit: Payer: 59 | Admitting: Neurology

## 2018-05-10 NOTE — Telephone Encounter (Signed)
I have already requested prior auth x2 and got the result that "insurance doesn't cover botox". [facility] stated that Botox is just no longer covered, which is why I had to send an appeal. Patient is aware of this information. Morrie Sheldonshley also called while I was at lunch and this is the same information they told her.

## 2018-05-10 NOTE — Telephone Encounter (Signed)
Sherelle Called from [facility] and left a voicemail stating she needed an urgent prior auth done for this patient's botox appt today @ 3. Please call her back at [phone] if you need more info or you can call and iniate the auth at [phone]. Thanks.

## 2018-05-10 NOTE — Telephone Encounter (Signed)
Tracy May, will you call [employer] and see if there is anything we can do to get auth for her appt today?  If you cannot, please have one of the other MA's/RN's assist.

## 2018-05-10 NOTE — Telephone Encounter (Signed)
Tracy May.  Ashley had told me she left a message for them.  I will need to work her in once get authorization

## 2018-05-21 ENCOUNTER — Telehealth: Payer: Self-pay | Admitting: Neurology

## 2018-05-21 NOTE — Telephone Encounter (Signed)
Bonita QuinLinda from [employer] calling about getting a Botox request . Bonita QuinLinda requesting chart notes faxed to [phone] and to change the start date from 04/26/18 to 05/20/18 due to the previous date being too far out.

## 2018-05-21 NOTE — Telephone Encounter (Signed)
[facility] with note that this was changed to an expedited appeal on 05/07/18 and should have only taken 3 days. Need an answer on BOTOX. No number left to reach them back.

## 2018-05-29 ENCOUNTER — Ambulatory Visit (INDEPENDENT_AMBULATORY_CARE_PROVIDER_SITE_OTHER): Payer: 59 | Admitting: Neurology

## 2018-05-29 DIAGNOSIS — G243 Spasmodic torticollis: Secondary | ICD-10-CM

## 2018-05-29 MED ORDER — ONABOTULINUMTOXINA 100 UNITS IJ SOLR
300.0000 [IU] | Freq: Once | INTRAMUSCULAR | Status: AC
  Administered 2018-05-29: 300 [IU] via INTRAMUSCULAR

## 2018-05-29 NOTE — Procedures (Signed)
[facility]   Procedure Note Botox  Attending: Dr. Lurena Joiner Dara Camargo  Preoperative Diagnosis(es): Cervical Dystonia  Result History  Pt was doing well and virtually pain free with botox/massage but insurance had issues with prior auth for botox and is late so much more pain lately  Consent obtained from: The patient Benefits discussed included, but were not limited to decreased muscle tightness, increased joint range of motion, and decreased pain.  Risk discussed included, but were not limited pain and discomfort, bleeding, bruising, excessive weakness, venous thrombosis, muscle atrophy and dysphagia.  A copy of the patient medication guide was given to the patient which explains the blackbox warning.  Patients identity and treatment sites confirmed Yes.  .  Details of Procedure: Skin was cleaned with alcohol.   Prior to injection, the needle plunger was aspirated to make sure the needle was not within a blood vessel.  There was no blood retrieved on aspiration.    Following is a summary of the muscles injected  And the amount of Botulinum toxin used:   Dilution 0.9% preservative free saline mixed with 100 u Botox type A to make 10 U per 0.1cc  Injections  Location Left  Right Units Number of sites        Sternocleidomastoid  60 60 1  Splenius Capitus, posterior approach 100  100 1  Splenius Capitus, lateral approach 40  40 1  Levator Scapulae      Trapezius  20/20/20 60 3        TOTAL UNITS:   260    Agent: Botulinum Type A ( Onobotulinum Toxin type A ).  2 vials of Botox were used, each containing 100 units and freshly diluted with 1 mL of sterile, non-preserved saline   Total injected (Units): 260  Total wasted (Units): 40   Pt tolerated procedure well without complications.   Reinjection is anticipated in 3 months.

## 2018-05-30 ENCOUNTER — Telehealth: Payer: Self-pay | Admitting: Neurology

## 2018-05-30 NOTE — Telephone Encounter (Signed)
Mychart message sent to patient.

## 2018-05-31 ENCOUNTER — Telehealth: Payer: Self-pay | Admitting: Neurology

## 2018-05-31 DIAGNOSIS — G243 Spasmodic torticollis: Secondary | ICD-10-CM

## 2018-05-31 NOTE — Telephone Encounter (Signed)
[facility] called to get new order for physical therapy. Is this something that was discussed at appt?  I let her know I would call back at [phone] or fax an order to [phone].  Dr. Arbutus Leas - please advise.

## 2018-05-31 NOTE — Telephone Encounter (Signed)
Referral faxed to number provided with confirmation received.  

## 2018-05-31 NOTE — Telephone Encounter (Signed)
Sounds good.  She has done it previously for dystonia

## 2018-06-05 ENCOUNTER — Telehealth: Payer: Self-pay | Admitting: Neurology

## 2018-06-05 NOTE — Telephone Encounter (Signed)
PT received the referral.

## 2018-06-05 NOTE — Telephone Encounter (Signed)
Patient called back and stated everything was good. The therapy facility just called her saying they already had the orders in. Disregard previous note! Thanks!

## 2018-06-05 NOTE — Telephone Encounter (Signed)
Patient called and left a voicemail stating that she was supposed to have a physical therapy appt tomorrow AM and they called her stating that they needed referral or verbal orders (she wasn't sure which one)  from Dr.Tat for this encounter. Please call them or call her back at [phone]. Thanks!

## 2018-06-05 NOTE — Telephone Encounter (Signed)
Noted  

## 2018-06-06 DIAGNOSIS — G243 Spasmodic torticollis: Secondary | ICD-10-CM | POA: Diagnosis not present

## 2018-06-12 DIAGNOSIS — G243 Spasmodic torticollis: Secondary | ICD-10-CM | POA: Diagnosis not present

## 2018-06-14 ENCOUNTER — Encounter: Payer: Self-pay | Admitting: Internal Medicine

## 2018-06-14 NOTE — Progress Notes (Signed)
Abstracted and sent to scan  

## 2018-06-17 DIAGNOSIS — G243 Spasmodic torticollis: Secondary | ICD-10-CM | POA: Diagnosis not present

## 2018-06-20 DIAGNOSIS — G243 Spasmodic torticollis: Secondary | ICD-10-CM | POA: Diagnosis not present

## 2018-06-25 DIAGNOSIS — G243 Spasmodic torticollis: Secondary | ICD-10-CM | POA: Diagnosis not present

## 2018-06-27 DIAGNOSIS — G243 Spasmodic torticollis: Secondary | ICD-10-CM | POA: Diagnosis not present

## 2018-07-01 ENCOUNTER — Other Ambulatory Visit: Payer: Self-pay | Admitting: Neurology

## 2018-07-04 DIAGNOSIS — G243 Spasmodic torticollis: Secondary | ICD-10-CM | POA: Diagnosis not present

## 2018-07-09 DIAGNOSIS — G243 Spasmodic torticollis: Secondary | ICD-10-CM | POA: Diagnosis not present

## 2018-08-29 DIAGNOSIS — N200 Calculus of kidney: Secondary | ICD-10-CM | POA: Diagnosis not present

## 2018-09-13 ENCOUNTER — Telehealth: Payer: Self-pay | Admitting: Neurology

## 2018-09-13 ENCOUNTER — Ambulatory Visit (INDEPENDENT_AMBULATORY_CARE_PROVIDER_SITE_OTHER): Payer: 59 | Admitting: Neurology

## 2018-09-13 DIAGNOSIS — G243 Spasmodic torticollis: Secondary | ICD-10-CM

## 2018-09-13 MED ORDER — ONABOTULINUMTOXINA 100 UNITS IJ SOLR
270.0000 [IU] | Freq: Once | INTRAMUSCULAR | Status: AC
  Administered 2018-09-13: 270 [IU] via INTRAMUSCULAR

## 2018-09-13 NOTE — Procedures (Signed)
[facility]   Procedure Note Botox  Attending: Dr. Kenya Sunya Humbarger  Preoperative Diagnosis(es): Cervical Dystonia  Result History  Pt was doing well and virtually pain free with botox/massage but insurance had issues with prior auth for botox and is late so much more pain lately  Consent obtained from: The patient Benefits discussed included, but were not limited to decreased muscle tightness, increased joint range of motion, and decreased pain.  Risk discussed included, but were not limited pain and discomfort, bleeding, bruising, excessive weakness, venous thrombosis, muscle atrophy and dysphagia.  A copy of the patient medication guide was given to the patient which explains the blackbox warning.  Patients identity and treatment sites confirmed Yes.  .  Details of Procedure: Skin was cleaned with alcohol.   Prior to injection, the needle plunger was aspirated to make sure the needle was not within a blood vessel.  There was no blood retrieved on aspiration.    Following is a summary of the muscles injected  And the amount of Botulinum toxin used:   Dilution 0.9% preservative free saline mixed with 100 u Botox type A to make 10 U per 0.1cc  Injections  Location Left  Right Units Number of sites        Sternocleidomastoid  60 60 1  Splenius Capitus, posterior approach 100  100 1  Splenius Capitus, lateral approach 40  40 1  Levator Scapulae      Trapezius  20/20/20 60 3        TOTAL UNITS:   260    Agent: Botulinum Type A ( Onobotulinum Toxin type A ).  3 vials of Botox were used, each containing 100 units and freshly diluted with 1 mL of sterile, non-preserved saline   Total injected (Units): 260  Total wasted (Units): 10   Pt tolerated procedure well without complications.   Reinjection is anticipated in 3 months. 

## 2018-09-13 NOTE — Telephone Encounter (Signed)
Referral faxed for physical therapy to [facility] at [phone] with confirmation received.

## 2018-09-30 DIAGNOSIS — G243 Spasmodic torticollis: Secondary | ICD-10-CM | POA: Diagnosis not present

## 2018-10-05 ENCOUNTER — Other Ambulatory Visit: Payer: Self-pay | Admitting: Internal Medicine

## 2018-10-08 DIAGNOSIS — G243 Spasmodic torticollis: Secondary | ICD-10-CM | POA: Diagnosis not present

## 2018-10-10 DIAGNOSIS — G243 Spasmodic torticollis: Secondary | ICD-10-CM | POA: Diagnosis not present

## 2018-10-15 DIAGNOSIS — G243 Spasmodic torticollis: Secondary | ICD-10-CM | POA: Diagnosis not present

## 2018-10-17 DIAGNOSIS — G243 Spasmodic torticollis: Secondary | ICD-10-CM | POA: Diagnosis not present

## 2018-10-22 DIAGNOSIS — G243 Spasmodic torticollis: Secondary | ICD-10-CM | POA: Diagnosis not present

## 2018-10-30 DIAGNOSIS — G243 Spasmodic torticollis: Secondary | ICD-10-CM | POA: Diagnosis not present

## 2018-10-31 DIAGNOSIS — Z01419 Encounter for gynecological examination (general) (routine) without abnormal findings: Secondary | ICD-10-CM | POA: Diagnosis not present

## 2018-10-31 DIAGNOSIS — Z6824 Body mass index (BMI) 24.0-24.9, adult: Secondary | ICD-10-CM | POA: Diagnosis not present

## 2018-12-12 ENCOUNTER — Encounter: Payer: Self-pay | Admitting: Internal Medicine

## 2018-12-12 DIAGNOSIS — Z1231 Encounter for screening mammogram for malignant neoplasm of breast: Secondary | ICD-10-CM | POA: Diagnosis not present

## 2018-12-12 LAB — HM DEXA SCAN

## 2018-12-13 ENCOUNTER — Encounter: Payer: Self-pay | Admitting: Neurology

## 2018-12-19 ENCOUNTER — Ambulatory Visit: Payer: 59 | Admitting: Neurology

## 2018-12-20 ENCOUNTER — Ambulatory Visit: Payer: 59 | Admitting: Neurology

## 2018-12-20 ENCOUNTER — Encounter: Payer: Self-pay | Admitting: Neurology

## 2018-12-20 NOTE — Progress Notes (Signed)
Botox approval valid through 05/20/2019. Patient to use buy and bill.

## 2018-12-27 MED ORDER — CLONAZEPAM 0.5 MG PO TABS: 0.2500 mg | ORAL_TABLET | Freq: Two times a day (BID) | ORAL | 0 refills | Status: DC | PRN

## 2019-01-02 ENCOUNTER — Encounter: Payer: Self-pay | Admitting: Internal Medicine

## 2019-01-10 ENCOUNTER — Ambulatory Visit (INDEPENDENT_AMBULATORY_CARE_PROVIDER_SITE_OTHER): Payer: 59 | Admitting: Neurology

## 2019-01-10 ENCOUNTER — Other Ambulatory Visit: Payer: Self-pay

## 2019-01-10 DIAGNOSIS — G243 Spasmodic torticollis: Secondary | ICD-10-CM | POA: Diagnosis not present

## 2019-01-10 MED ORDER — ONABOTULINUMTOXINA 100 UNITS IJ SOLR
260.0000 [IU] | Freq: Once | INTRAMUSCULAR | Status: AC
  Administered 2019-01-10: 14:00:00 260 [IU] via INTRAMUSCULAR

## 2019-01-10 NOTE — Progress Notes (Signed)
[facility]   Procedure Note Botox  Attending: Dr. Lurena Joiner Tat  Preoperative Diagnosis(es): Cervical Dystonia  Result History  Pt was doing well and virtually pain free with botox/massage but insurance had issues with prior auth for botox and is late so much more pain lately  Consent obtained from: The patient Benefits discussed included, but were not limited to decreased muscle tightness, increased joint range of motion, and decreased pain.  Risk discussed included, but were not limited pain and discomfort, bleeding, bruising, excessive weakness, venous thrombosis, muscle atrophy and dysphagia.  A copy of the patient medication guide was given to the patient which explains the blackbox warning.  Patients identity and treatment sites confirmed Yes.  .  Details of Procedure: Skin was cleaned with alcohol.   Prior to injection, the needle plunger was aspirated to make sure the needle was not within a blood vessel.  There was no blood retrieved on aspiration.    Following is a summary of the muscles injected  And the amount of Botulinum toxin used:   Dilution 0.9% preservative free saline mixed with 100 u Botox type A to make 10 U per 0.1cc  Injections  Location Left  Right Units Number of sites        Sternocleidomastoid  60 60 1  Splenius Capitus, posterior approach 100  100 1  Splenius Capitus, lateral approach 40  40 1  Levator Scapulae      Trapezius  20/20/20 60 3        TOTAL UNITS:   260    Agent: Botulinum Type A ( Onobotulinum Toxin type A ).  3 vials of Botox were used, each containing 100 units and freshly diluted with 1 mL of sterile, non-preserved saline   Total injected (Units): 260  Total wasted (Units): 10   Pt tolerated procedure well without complications.   Reinjection is anticipated in 3 months.

## 2019-01-23 ENCOUNTER — Encounter: Payer: Self-pay | Admitting: Internal Medicine

## 2019-01-24 ENCOUNTER — Other Ambulatory Visit: Payer: Self-pay

## 2019-01-24 ENCOUNTER — Ambulatory Visit (INDEPENDENT_AMBULATORY_CARE_PROVIDER_SITE_OTHER): Payer: 59 | Admitting: Internal Medicine

## 2019-01-24 ENCOUNTER — Encounter: Payer: Self-pay | Admitting: Internal Medicine

## 2019-01-24 DIAGNOSIS — R1310 Dysphagia, unspecified: Secondary | ICD-10-CM

## 2019-01-24 DIAGNOSIS — R635 Abnormal weight gain: Secondary | ICD-10-CM

## 2019-01-24 DIAGNOSIS — E042 Nontoxic multinodular goiter: Secondary | ICD-10-CM

## 2019-01-24 NOTE — Progress Notes (Signed)
Patient ID: Tracy May, female   DOB: Oct 06, 1955, 63 y.o.   MRN: [id number]   Patient location: Home My location: Office  Referring Provider: Tresa Garter, MD  I connected with the patient on 01/24/19 at  9:22 AM EDT by a video enabled telemedicine application and verified that I am speaking with the correct person.   I discussed the limitations of evaluation and management by telemedicine and the availability of in person appointments. The patient expressed understanding and agreed to proceed.   Details of the encounter are shown below.  HPI  Tracy May is a 63 y.o.-year-old female, resenting for follow-up for thyroid nodules.  Last visit 1 year ago  Reviewed and addended history: Patient has a head tremor >> referred to neurology >> dx of cervical dystonia >> started Botox injections.  Part of the investigation for her neck dystonia >> cervical MRI, which showed a possible thyroid nodule.  A thyroid ultrasound obtained subsequently showed bilateral thyroid nodules.  Thyroid U/S (01/01/2017): 1) - 1.5 cm poorly defined right thyroid nodule meets criteria for biopsy. 2) - 2.6 cm left thyroid nodule meets criteria for biopsy.  The 2 nodules were Bx'ed (01/09/2017): 1) - scant epithelial sample >> inconclusive result 2) - benign follicular nodule  The inconclusive biopsy was repeated (07/10/2017): benign  Pt denies: - feeling nodules in neck - hoarseness - choking - SOB with lying down But she does have dysphagia which usually happens after she gets her Botox injection for cervical dystonia.  TFTs have been normal: Lab Results  Component Value Date   TSH 0.92 02/01/2018   TSH 1.19 11/10/2016   TSH 1.42 09/17/2015   TSH 0.70 08/26/2014   TSH 0.47 10/16/2012   TSH 0.66 09/15/2011   TSH 0.50 09/09/2010   TSH 0.92 12/13/2009   TSH 0.68 11/27/2008   TSH 0.55 02/21/2008    + FH of thyroid ds  -maternal aunt -? thyroid cancer.No FH of thyroid cancer. No h/o  radiation tx to head or neck.  No seaweed or kelp. No recent contrast studies. No herbal supplements.+ Biotin use - 5000 mcg a week. No recent steroids use.   She has a h/o BPPV, hyperlipidemia.  She was on the [employer].  ROS: Constitutional: + weight gain - 4 lbs/no weight loss, no fatigue, no subjective hyperthermia, no subjective hypothermia Eyes: no blurry vision, no xerophthalmia ENT: no sore throat, + see HPI Cardiovascular: no CP/no SOB/no palpitations/no leg swelling Respiratory: no cough/no SOB/no wheezing Gastrointestinal: no N/no V/no D/no C/no acid reflux Musculoskeletal: no muscle aches/no joint aches Skin: no rashes, no hair loss Neurological: no tremors/no numbness/no tingling/no dizziness  I reviewed pt's medications, allergies, PMH, social hx, family hx, and changes were documented in the history of present illness. Otherwise, unchanged from my initial visit note.  Past Medical History:  Diagnosis Date  . Acne   . Allergic rhinitis   . Cervical dystonia 2017  . GERD (gastroesophageal reflux disease)   . PONV (postoperative nausea and vomiting)   . Thyroid nodule 12/2016  . Vertigo    Past Surgical History:  Procedure Laterality Date  . ABDOMINAL HYSTERECTOMY    . BUNIONECTOMY Right   . CHOLECYSTECTOMY N/A 03/13/2018   Procedure: LAPAROSCOPIC CHOLECYSTECTOMY;  Surgeon: Berna Bue, MD;  Location: [facility];  Service: General;  Laterality: N/A;  . COLONOSCOPY    . ECTOPIC PREGNANCY SURGERY    . MYOMECTOMY    . WISDOM TOOTH EXTRACTION  Social History   Social History  . Marital status: Married    Spouse name: N/A  . Number of children: 1   Occupational History  . C[employer]    clinic Production designer, theatre/television/filmmanager   Social History Main Topics  . Smoking status: Former Smoker    Quit date: 11/13/1996  . Smokeless tobacco: Never Used  . Alcohol use Yes     Comment:1-4  glass of wine a month  . Drug use: No   Social History Narrative   Regular  Exercise- yes   Current Outpatient Medications on File Prior to Visit  Medication Sig Dispense Refill  . acetaminophen (APAP EXTRA STRENGTH) 500 MG tablet Take 1,000 mg by mouth every 6 (six) hours as needed for moderate pain or headache.     . Azelaic Acid (FINACEA) 15 % cream Apply 1 application topically 2 (two) times daily as needed (for acne). After skin is thoroughly washed and patted dry, gently but thoroughly massage a thin film of azelaic acid cream into the affected area twice daily, in the morning and evening.     Marland Kitchen. b complex vitamins tablet Take 1 tablet by mouth daily. 100 tablet 3  . Biotin (BIOTIN MAXIMUM STRENGTH) 5 MG CAPS Take 5 capsules by mouth every Monday, Wednesday, and Friday.     . cetirizine (ZYRTEC) 10 MG tablet Take 10 mg by mouth daily as needed for allergies.     . Cholecalciferol 2000 UNITS TABS Take 2,000 Units by mouth daily.     . clonazePAM (KLONOPIN) 0.5 MG tablet Take 0.5 tablets (0.25 mg total) by mouth 2 (two) times daily as needed for anxiety. 90 tablet 0  . estradiol (MINIVELLE) 0.05 MG/24HR patch Place 1 patch onto the skin 2 (two) times a week.    . Magnesium 250 MG TABS Take 250 mg by mouth daily.     . meclizine (ANTIVERT) 12.5 MG tablet TAKE 1 TABLET(12.5 MG) BY MOUTH DAILY AS NEEDED FOR DIZZINESS 30 tablet 0  . meloxicam (MOBIC) 15 MG tablet TAKE 1 TABLET(15 MG) BY MOUTH DAILY AS NEEDED FOR PAIN 90 tablet 1  . tretinoin (RETIN-A) 0.025 % cream Apply 1 application topically at bedtime as needed (for acne).    . zolpidem (AMBIEN) 10 MG tablet Take 1 tablet (10 mg total) by mouth at bedtime as needed. for sleep (Patient taking differently: Take 10 mg by mouth at bedtime. ) 30 tablet 3   No current facility-administered medications on file prior to visit.    Allergies  Allergen Reactions  . Azithromycin Nausea Only and Other (See Comments)    Stomach pains  . Sulfonamide Derivatives Itching   Family History  Problem Relation Age of Onset  .  Cancer Mother        neck  . Cancer Sister        glioblastoma  . Diabetes Father   . Lung cancer Father    PE: There were no vitals taken for this visit. Wt Readings from Last 3 Encounters:  03/21/18 134 lb (60.8 kg)  03/13/18 132 lb 6 oz (60 kg)  03/07/18 132 lb 6 oz (60 kg)   Constitutional:  in NAD  The physical exam was not performed (virtual visit).  ASSESSMENT: 1. Multiple thyroid nodules  2.  Dysphagia  3.  Weight gain  PLAN: 1. Multiple thyroid nodules -Reviewed the report of her thyroid ultrasound from 01/2017:  1) R nodule was not large, however, it was solid, with internal blood flow,  and not very well delimited from the surrounding tissue.  This was possibly a pseudo-nodule, but difficult to tell.  It was first biopsied/2018 but the sample was inconclusive due to scant material.  We repeated the biopsy in 06/2017 and the result was benign.  2) L nodule appeared to be accompanied to nodules, though solid, and one with apparent macrocalcifications.  Both had internal blood flow but not very well delimited from the surrounding tissues.  Was biopsied and the results were benign.  We do not need to rebiopsy this nodule unless it changes ultrasound characteristics over time.   She does not have a thyroid cancer family history of personal history of radiation therapy to head or neck.  All of these would favor benignity.  No neck compression symptoms except for dysphagia   we discussed about repeating another ultrasound this summer.    If the nodule appears stable, we can continue to follow her clinically and maybe repeat another ultrasound in 3-5 years  Discussed about the fact that she should let me know if she develops any neck compression symptoms (explained what these are)  Reviewed TFTs and these were normal.  She will need another TSH at next lab draw with PCP.  I will see her back in a year.  Patient Instructions  Please come back in 1 year.  Please  send me a message through my chart during the summer so I can order  If you have labs with Dr. Posey Rea, please also have a TSH (thyroid test).  Please remember to start biotin 1 week prior to the labs  2.  Dysphagia -We discussed that this can be a sign of nodules growing, however, she noticed that this appears to be pronounced when she gets the Botox injections.  3.  Weight gain -Previously on [employer], on which she lost a significant amount of weight -She only gained 4 pounds recently, most likely due to coronavirus pandemic stress -We will need to check another TSH -we will have this checked by PCP during the summer  Carlus Pavlov, MD PhD [facility] Endocrinology

## 2019-01-24 NOTE — Patient Instructions (Signed)
Please come back in 1 year.  Please send me a message through my chart during the summer so I can order  If you have labs with Dr. Posey Rea, please also have a TSH (thyroid test).  Please remember to start biotin 1 week prior to the labs

## 2019-01-24 NOTE — Addendum Note (Signed)
Addended by: Carlus Pavlov on: 01/24/2019 09:38 AM   Modules accepted: Level of Service

## 2019-02-02 IMAGING — US US THYROID BIOPSY
1 series · 13 of 20 positions shown · non-contrast
Comparison: Thyroid ultrasound performed 01/01/2017

MEDICATIONS:
None

COMPLICATIONS:
None immediate.

INDICATION: Indeterminate bilateral thyroid nodules

EXAM:
ULTRASOUND GUIDED FINE NEEDLE ASPIRATION OF INDETERMINATE BILATERAL
THYROID NODULES
TECHNIQUE: Informed written consent was obtained from the patient after a
discussion of the risks, benefits and alternatives to treatment.
Questions regarding the procedure were encouraged and answered. A
timeout was performed prior to the initiation of the procedure.

[Series 1: us thyroid biopsy · 0.05mm/px · 20 acquisitions, 13 frames shown]
[im 1/20]
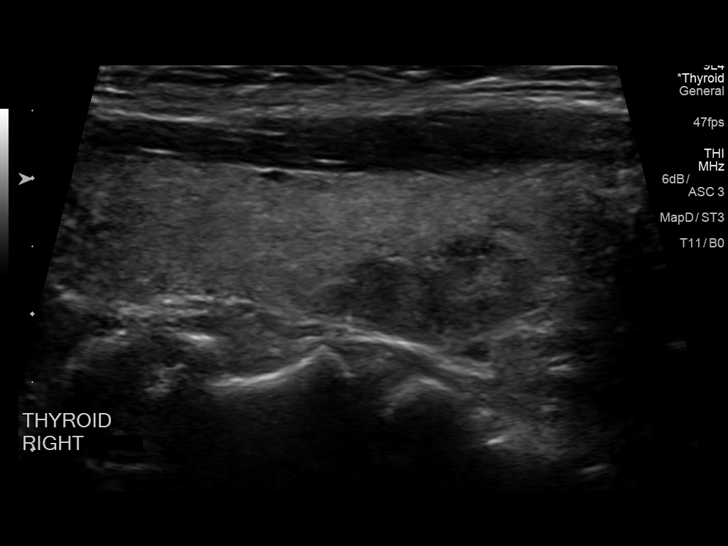
[im 3/20]
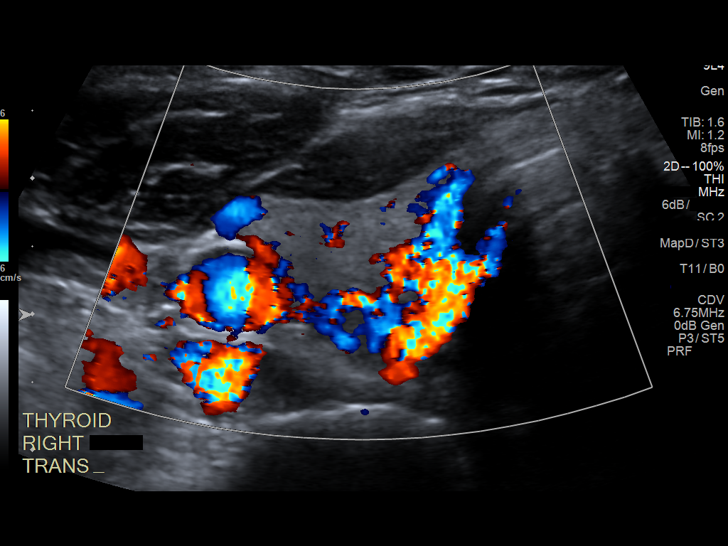
[im 4/20]
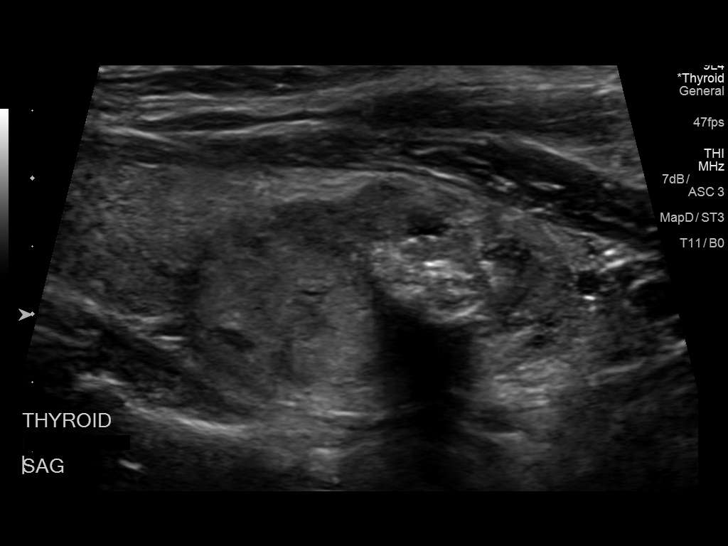
[im 6/20]
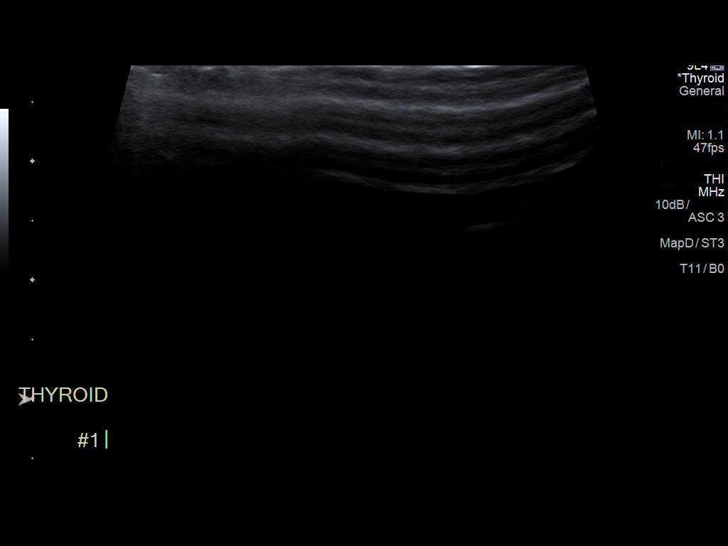
[im 7/20]
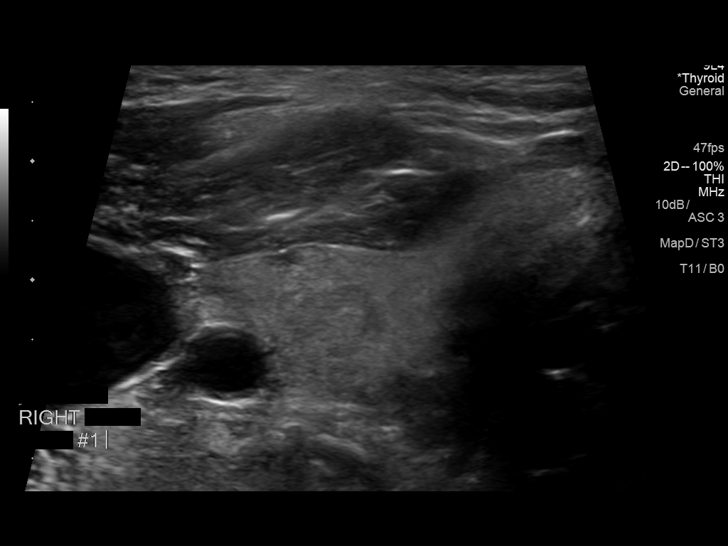
[im 9/20]
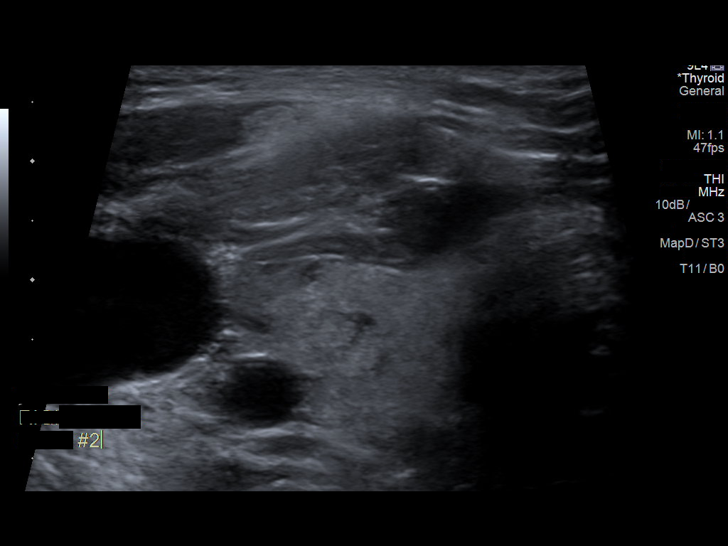
[im 11/20]
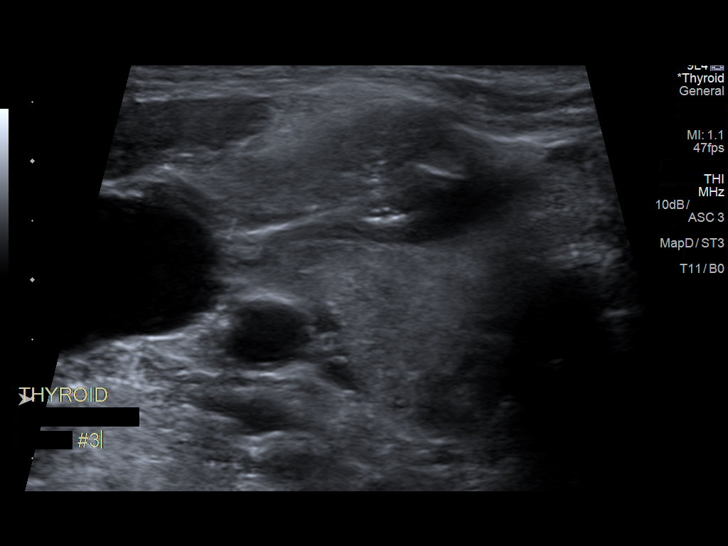
[im 12/20]
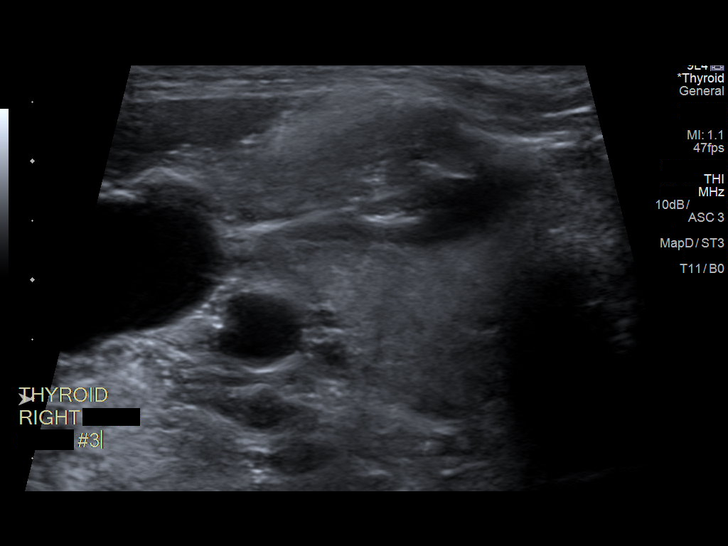
[im 14/20]
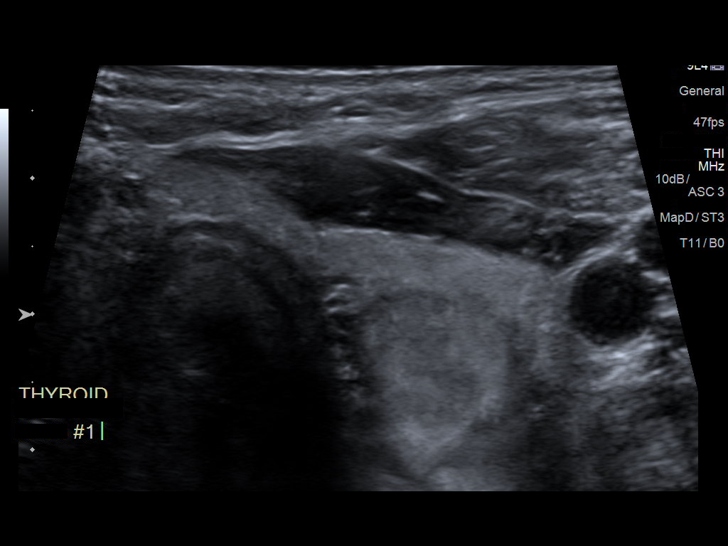
[im 15/20]
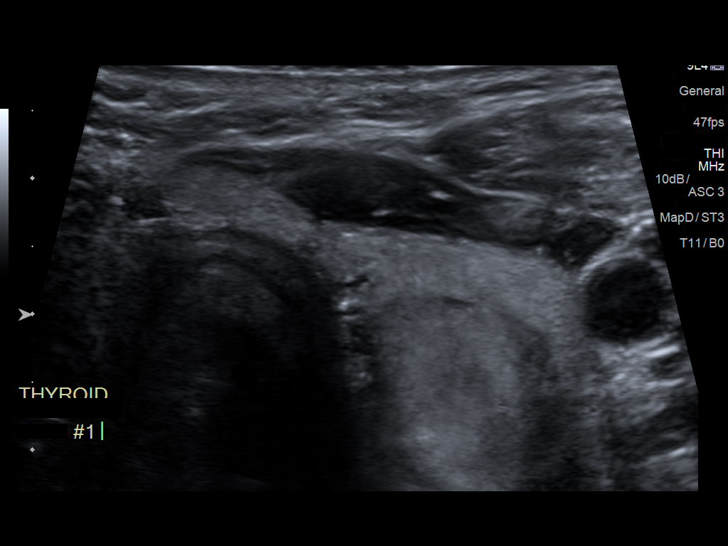
[im 17/20]
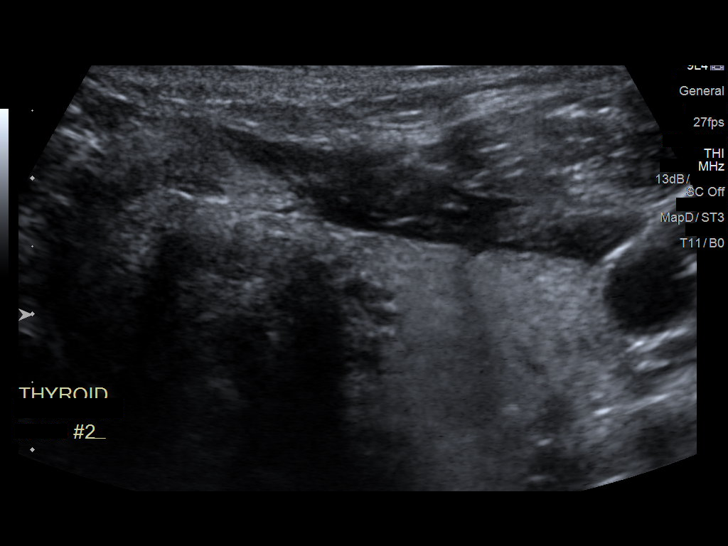
[im 18/20]
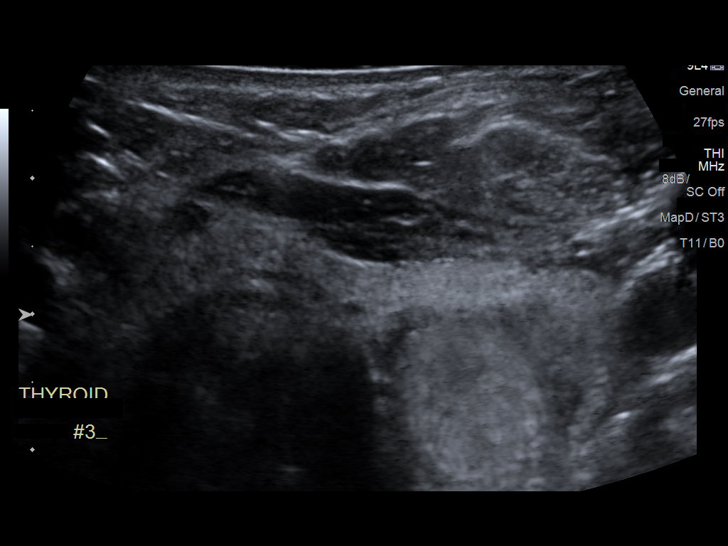
[im 20/20]
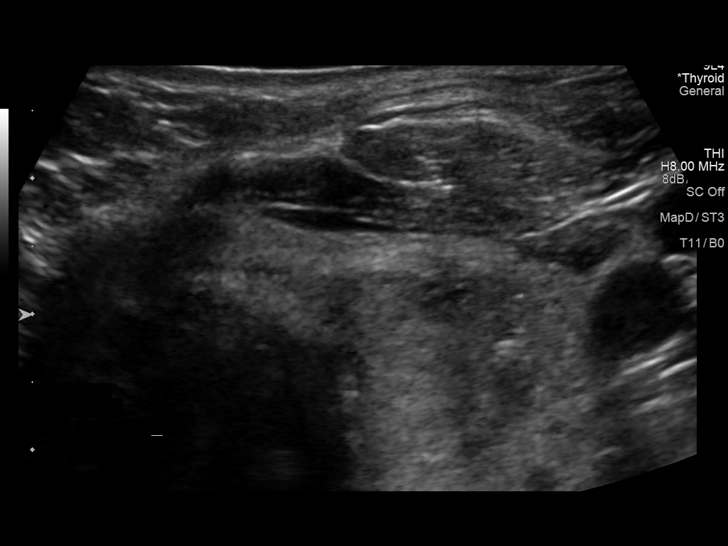

[13 of 20 positions shown; findings below may reference images not displayed]

Pre-procedural ultrasound scanning demonstrated unchanged size and
appearance of the indeterminate nodules within the right mid and
left mid inferior thyroid lobes.

The procedure was planned. The neck was prepped in the usual sterile
fashion, and a sterile drape was applied covering the operative
field. A timeout was performed prior to the initiation of the
procedure. Local anesthesia was provided with 1% lidocaine.

Under direct ultrasound guidance, 3 FNA biopsies were performed of
the left mid inferior thyroid nodule with a 25 gauge needle.
Multiple ultrasound images were saved for procedural documentation
purposes. The samples were prepared and submitted to pathology.

Under direct ultrasound guidance, 3 FNA biopsies were performed of
the right mid posterior thyroid nodule with a 25 gauge needle.
Multiple ultrasound images were saved for procedural documentation
purposes. The samples were prepared and submitted to pathology.

Limited post procedural scanning was negative for hematoma or
additional complication. Dressings were placed. The patient
tolerated the above procedures procedure well without immediate
postprocedural complication.
FINDINGS: FINDINGS
Nodule reference number based on prior diagnostic ultrasound: 1

Maximum size:  2.6 cm

Location: Left  ;  Mid

ACR TI-RADS total points: 7

ACR TI-RADS risk category:   TR5 (>/= 7 points)

Prior biopsy:  No

Reason for biopsy: meets ACR TI-RADS criteria

_________________________________________________________

Nodule reference number based on prior diagnostic ultrasound: 2

Maximum size:  1.5 cm

Location: Right  ;  Mid

ACR TI-RADS total points: 4

ACR TI-RADS risk category:   TR4 (4-6 points)

Prior biopsy:  No

Reason for biopsy: meets ACR TI-RADS criteria

Ultrasound imaging confirms appropriate placement of the needles
within the thyroid nodule.
IMPRESSION: 1. Technically successful ultrasound guided fine needle aspiration
of left mid inferior thyroid nodule as described above.
2. Technically successful ultrasound guided fine needle aspiration
of right mid posterior thyroid nodule as described above.

## 2019-02-14 ENCOUNTER — Encounter: Payer: Self-pay | Admitting: Internal Medicine

## 2019-03-24 ENCOUNTER — Encounter: Payer: Self-pay | Admitting: Internal Medicine

## 2019-03-24 ENCOUNTER — Ambulatory Visit (INDEPENDENT_AMBULATORY_CARE_PROVIDER_SITE_OTHER): Payer: 59 | Admitting: Internal Medicine

## 2019-03-24 ENCOUNTER — Other Ambulatory Visit: Payer: Self-pay

## 2019-03-24 ENCOUNTER — Other Ambulatory Visit: Payer: 59

## 2019-03-24 VITALS — BP 122/80 | HR 60 | Temp 97.9°F | Ht 64.0 in | Wt 139.0 lb

## 2019-03-24 DIAGNOSIS — Z0000 Encounter for general adult medical examination without abnormal findings: Secondary | ICD-10-CM | POA: Diagnosis not present

## 2019-03-24 DIAGNOSIS — R1013 Epigastric pain: Secondary | ICD-10-CM | POA: Diagnosis not present

## 2019-03-24 DIAGNOSIS — L309 Dermatitis, unspecified: Secondary | ICD-10-CM | POA: Insufficient documentation

## 2019-03-24 DIAGNOSIS — K625 Hemorrhage of anus and rectum: Secondary | ICD-10-CM | POA: Diagnosis not present

## 2019-03-24 DIAGNOSIS — J4520 Mild intermittent asthma, uncomplicated: Secondary | ICD-10-CM | POA: Diagnosis not present

## 2019-03-24 MED ORDER — TRIAMCINOLONE ACETONIDE 0.5 % EX CREA: 1.0000 "application " | TOPICAL_CREAM | Freq: Four times a day (QID) | CUTANEOUS | 1 refills | Status: AC

## 2019-03-24 NOTE — Assessment & Plan Note (Signed)
On back -- Triam cream

## 2019-03-24 NOTE — Progress Notes (Signed)
Subjective:  Patient ID: Tracy May, female    DOB: [birth year]  Age: 63 y.o. MRN: [id number]  CC: No chief complaint on file.   HPI Tracy May presents for a well exam C/o rash on the back x 1 mo ago C/o rectal bleed - Dr Buccini's PA on video call today (last colon 2019). Pt had some LUQ pain  Outpatient Medications Prior to Visit  Medication Sig Dispense Refill  . acetaminophen (APAP EXTRA STRENGTH) 500 MG tablet Take 1,000 mg by mouth every 6 (six) hours as needed for moderate pain or headache.     . Azelaic Acid (FINACEA) 15 % cream Apply 1 application topically 2 (two) times daily as needed (for acne). After skin is thoroughly washed and patted dry, gently but thoroughly massage a thin film of azelaic acid cream into the affected area twice daily, in the morning and evening.     Marland Kitchen b complex vitamins tablet Take 1 tablet by mouth daily. 100 tablet 3  . Biotin (BIOTIN MAXIMUM STRENGTH) 5 MG CAPS Take 5 capsules by mouth every Monday, Wednesday, and Friday.     . cetirizine (ZYRTEC) 10 MG tablet Take 10 mg by mouth daily as needed for allergies.     . Cholecalciferol 2000 UNITS TABS Take 2,000 Units by mouth daily.     Marland Kitchen estradiol (MINIVELLE) 0.05 MG/24HR patch Place 1 patch onto the skin 2 (two) times a week.    . Magnesium 250 MG TABS Take 250 mg by mouth daily.     . meclizine (ANTIVERT) 12.5 MG tablet TAKE 1 TABLET(12.5 MG) BY MOUTH DAILY AS NEEDED FOR DIZZINESS 30 tablet 0  . meloxicam (MOBIC) 15 MG tablet TAKE 1 TABLET(15 MG) BY MOUTH DAILY AS NEEDED FOR PAIN 90 tablet 1  . tretinoin (RETIN-A) 0.025 % cream Apply 1 application topically at bedtime as needed (for acne).    . zolpidem (AMBIEN) 10 MG tablet Take 1 tablet (10 mg total) by mouth at bedtime as needed. for sleep (Patient taking differently: Take 10 mg by mouth at bedtime. ) 30 tablet 3  . clonazePAM (KLONOPIN) 0.5 MG tablet Take 0.5 tablets (0.25 mg total) by mouth 2 (two) times daily as needed for anxiety. (Patient  not taking: Reported on 01/24/2019) 90 tablet 0   No facility-administered medications prior to visit.     ROS: Review of Systems  Constitutional: Negative for activity change, appetite change, chills, fatigue and unexpected weight change.  HENT: Negative for congestion, mouth sores and sinus pressure.   Eyes: Negative for visual disturbance.  Respiratory: Negative for cough and chest tightness.   Gastrointestinal: Negative for abdominal pain and nausea.  Genitourinary: Negative for difficulty urinating, frequency and vaginal pain.  Musculoskeletal: Negative for back pain and gait problem.  Skin: Negative for pallor and rash.  Neurological: Negative for dizziness, tremors, weakness, numbness and headaches.  Psychiatric/Behavioral: Negative for confusion, sleep disturbance and suicidal ideas.    Objective:  BP 122/80 (BP Location: Left Arm, Patient Position: Sitting, Cuff Size: Normal)   Pulse 60   Temp 97.9 F (36.6 C) (Oral)   Ht 5\' 4"  (1.626 m)   Wt 139 lb (63 kg)   SpO2 98%   BMI 23.86 kg/m   BP Readings from Last 3 Encounters:  03/24/19 122/80  03/21/18 128/72  03/13/18 118/80    Wt Readings from Last 3 Encounters:  03/24/19 139 lb (63 kg)  03/21/18 134 lb (60.8 kg)  03/13/18 132 lb 6 oz (  60 kg)    Physical Exam Constitutional:      General: She is not in acute distress.    Appearance: She is well-developed.  HENT:     Head: Normocephalic.     Right Ear: External ear normal.     Left Ear: External ear normal.     Nose: Nose normal.  Eyes:     General:        Right eye: No discharge.        Left eye: No discharge.     Conjunctiva/sclera: Conjunctivae normal.     Pupils: Pupils are equal, round, and reactive to light.  Neck:     Musculoskeletal: Normal range of motion and neck supple.     Thyroid: No thyromegaly.     Vascular: No JVD.     Trachea: No tracheal deviation.  Cardiovascular:     Rate and Rhythm: Normal rate and regular rhythm.     Heart  sounds: Normal heart sounds.  Pulmonary:     Effort: No respiratory distress.     Breath sounds: No stridor. No wheezing.  Abdominal:     General: Bowel sounds are normal. There is no distension.     Palpations: Abdomen is soft. There is no mass.     Tenderness: There is no abdominal tenderness. There is no guarding or rebound.  Musculoskeletal:        General: No tenderness.  Lymphadenopathy:     Cervical: No cervical adenopathy.  Skin:    Findings: No erythema or rash.  Neurological:     Cranial Nerves: No cranial nerve deficit.     Motor: No abnormal muscle tone.     Coordination: Coordination normal.     Deep Tendon Reflexes: Reflexes normal.  Psychiatric:        Behavior: Behavior normal.        Thought Content: Thought content normal.        Judgment: Judgment normal.     Lab Results  Component Value Date   WBC 6.0 03/07/2018   HGB 13.5 03/07/2018   HCT 39.4 03/07/2018   PLT 345 03/07/2018   GLUCOSE 95 03/07/2018   CHOL 208 (H) 02/01/2018   TRIG 30.0 02/01/2018   HDL 76.00 02/01/2018   LDLDIRECT 102.3 10/16/2012   LDLCALC 126 (H) 02/01/2018   ALT 16 02/01/2018   AST 14 02/01/2018   NA 140 03/07/2018   K 4.1 03/07/2018   CL 103 03/07/2018   CREATININE 0.77 03/07/2018   BUN 14 03/07/2018   CO2 30 03/07/2018   TSH 0.92 02/01/2018    No results found.  Assessment & Plan:   There are no diagnoses linked to this encounter.   No orders of the defined types were placed in this encounter.    Follow-up: No follow-ups on file.  Sonda PrimesAlex , MD

## 2019-03-24 NOTE — Patient Instructions (Addendum)
[facility]  Cardiac CT calcium scoring test $150   Computed tomography, more commonly known as a CT or CAT scan, is a diagnostic medical imaging test. Like traditional x-rays, it produces multiple images or pictures of the inside of the body. The cross-sectional images generated during a CT scan can be reformatted in multiple planes. They can even generate three-dimensional images. These images can be viewed on a computer monitor, printed on film or by a 3D printer, or transferred to a CD or DVD. CT images of internal organs, bones, soft tissue and blood vessels provide greater detail than traditional x-rays, particularly of soft tissues and blood vessels. A cardiac CT scan for coronary calcium is a non-invasive way of obtaining information about the presence, location and extent of calcified plaque in the coronary arteries-the vessels that supply oxygen-containing blood to the heart muscle. Calcified plaque results when there is a build-up of fat and other substances under the inner layer of the artery. This material can calcify which signals the presence of atherosclerosis, a disease of the vessel wall, also called coronary artery disease (CAD). People with this disease have an increased risk for heart attacks. In addition, over time, progression of plaque build up (CAD) can narrow the arteries or even close off blood flow to the heart. The result may be chest pain, sometimes called "angina," or a heart attack. Because calcium is a marker of CAD, the amount of calcium detected on a cardiac CT scan is a helpful prognostic tool. The findings on cardiac CT are expressed as a calcium score. Another name for this test is coronary artery calcium scoring.  What are some common uses of the procedure? The goal of cardiac CT scan for calcium scoring is to determine if CAD is present and to what extent, even if there are no symptoms. It is a screening study that may be recommended by a physician for patients with  risk factors for CAD but no clinical symptoms. The major risk factors for CAD are: . high blood cholesterol levels  . family history of heart attacks  . diabetes  . high blood pressure  . cigarette smoking  . overweight or obese  . physical inactivity   A negative cardiac CT scan for calcium scoring shows no calcification within the coronary arteries. This suggests that CAD is absent or so minimal it cannot be seen by this technique. The chance of having a heart attack over the next two to five years is very low under these circumstances. A positive test means that CAD is present, regardless of whether or not the patient is experiencing any symptoms. The amount of calcification-expressed as the calcium score-may help to predict the likelihood of a myocardial infarction (heart attack) in the coming years and helps your medical doctor or cardiologist decide whether the patient may need to take preventive medicine or undertake other measures such as diet and exercise to lower the risk for heart attack. The extent of CAD is graded according to your calcium score:  Calcium Score  Presence of CAD  0 No evidence of CAD   1-10 Minimal evidence of CAD  11-100 Mild evidence of CAD  101-400 Moderate evidence of CAD  Over 400 Extensive evidence of CAD

## 2019-03-24 NOTE — Assessment & Plan Note (Signed)
rectal bleed - Dr Buccini's PA on video call today (last colon 2019)

## 2019-03-24 NOTE — Assessment & Plan Note (Signed)
We discussed age appropriate health related issues, including available/recomended screening tests and vaccinations. We discussed a need for adhering to healthy diet and exercise. Labs were ordered to be later reviewed . All questions were answered.  Colon 2019 

## 2019-03-24 NOTE — Assessment & Plan Note (Signed)
Mild as a child Declined pneumovax

## 2019-03-26 ENCOUNTER — Encounter: Payer: Self-pay | Admitting: Internal Medicine

## 2019-03-26 ENCOUNTER — Ambulatory Visit: Payer: 59 | Admitting: Podiatry

## 2019-03-26 ENCOUNTER — Ambulatory Visit (INDEPENDENT_AMBULATORY_CARE_PROVIDER_SITE_OTHER): Payer: 59

## 2019-03-26 ENCOUNTER — Other Ambulatory Visit (INDEPENDENT_AMBULATORY_CARE_PROVIDER_SITE_OTHER): Payer: 59

## 2019-03-26 ENCOUNTER — Other Ambulatory Visit: Payer: Self-pay | Admitting: Podiatry

## 2019-03-26 ENCOUNTER — Other Ambulatory Visit: Payer: Self-pay

## 2019-03-26 ENCOUNTER — Encounter: Payer: Self-pay | Admitting: Podiatry

## 2019-03-26 VITALS — BP 127/76 | HR 65 | Temp 97.9°F

## 2019-03-26 DIAGNOSIS — M79671 Pain in right foot: Secondary | ICD-10-CM

## 2019-03-26 DIAGNOSIS — M779 Enthesopathy, unspecified: Secondary | ICD-10-CM | POA: Diagnosis not present

## 2019-03-26 DIAGNOSIS — Z0000 Encounter for general adult medical examination without abnormal findings: Secondary | ICD-10-CM

## 2019-03-26 DIAGNOSIS — K625 Hemorrhage of anus and rectum: Secondary | ICD-10-CM | POA: Diagnosis not present

## 2019-03-26 DIAGNOSIS — M778 Other enthesopathies, not elsewhere classified: Secondary | ICD-10-CM

## 2019-03-26 LAB — CBC WITH DIFFERENTIAL/PLATELET
Basophils Absolute: 0 10*3/uL (ref 0.0–0.1)
Basophils Relative: 0.6 % (ref 0.0–3.0)
Eosinophils Absolute: 0.2 10*3/uL (ref 0.0–0.7)
Eosinophils Relative: 4.4 % (ref 0.0–5.0)
HCT: 40.2 % (ref 36.0–46.0)
Hemoglobin: 13.7 g/dL (ref 12.0–15.0)
Lymphocytes Relative: 33.4 % (ref 12.0–46.0)
Lymphs Abs: 1.7 10*3/uL (ref 0.7–4.0)
MCHC: 34.1 g/dL (ref 30.0–36.0)
MCV: 95.2 fl (ref 78.0–100.0)
Monocytes Absolute: 0.4 10*3/uL (ref 0.1–1.0)
Monocytes Relative: 8.2 % (ref 3.0–12.0)
Neutro Abs: 2.7 10*3/uL (ref 1.4–7.7)
Neutrophils Relative %: 53.4 % (ref 43.0–77.0)
Platelets: 319 10*3/uL (ref 150.0–400.0)
RBC: 4.23 Mil/uL (ref 3.87–5.11)
RDW: 12.8 % (ref 11.5–15.5)
WBC: 5.1 10*3/uL (ref 4.0–10.5)

## 2019-03-26 LAB — BASIC METABOLIC PANEL
BUN: 9 mg/dL (ref 6–23)
CO2: 29 mEq/L (ref 19–32)
Calcium: 8.8 mg/dL (ref 8.4–10.5)
Chloride: 104 mEq/L (ref 96–112)
Creatinine, Ser: 0.79 mg/dL (ref 0.40–1.20)
GFR: 89.4 mL/min (ref >60.00)
Glucose, Bld: 88 mg/dL (ref 70–99)
Potassium: 4.2 mEq/L (ref 3.5–5.1)
Sodium: 138 mEq/L (ref 135–145)

## 2019-03-26 LAB — LIPID PANEL
Cholesterol: 205 mg/dL — ABNORMAL HIGH (ref 0–200)
HDL: 79.8 mg/dL (ref >39.00)
LDL Cholesterol: 119 mg/dL — ABNORMAL HIGH (ref 0–99)
NonHDL: 125.46
Total CHOL/HDL Ratio: 3
Triglycerides: 30 mg/dL (ref 0.0–149.0)
VLDL: 6 mg/dL (ref 0.0–40.0)

## 2019-03-26 LAB — HEPATIC FUNCTION PANEL
ALT: 19 U/L (ref 0–35)
AST: 17 U/L (ref 0–37)
Albumin: 4 g/dL (ref 3.5–5.2)
Alkaline Phosphatase: 47 U/L (ref 39–117)
Bilirubin, Direct: 0.1 mg/dL (ref 0.0–0.3)
Total Bilirubin: 0.6 mg/dL (ref 0.2–1.2)
Total Protein: 6.6 g/dL (ref 6.0–8.3)

## 2019-03-26 LAB — URINALYSIS
Bilirubin Urine: NEGATIVE
Hgb urine dipstick: NEGATIVE
Ketones, ur: NEGATIVE
Leukocytes,Ua: NEGATIVE
Nitrite: NEGATIVE
Specific Gravity, Urine: 1.015 (ref 1.000–1.030)
Total Protein, Urine: NEGATIVE
Urine Glucose: NEGATIVE
Urobilinogen, UA: 0.2 (ref 0.0–1.0)
pH: 8.5 — AB (ref 5.0–8.0)

## 2019-03-26 LAB — LIPASE: Lipase: 27 U/L (ref 11.0–59.0)

## 2019-03-26 LAB — TSH: TSH: 1.38 u[IU]/mL (ref 0.35–4.50)

## 2019-03-26 NOTE — Progress Notes (Signed)
Subjective:   Patient ID: Tracy May, female   DOB: 63 y.o.   MRN: [id number]   HPI Patient presents stating having a lot of pain in the big toe joint right and states that she does not remember specific injury.  Patient did have surgery on the big toe joint at least 10 years ago and it is a different joint that is hurting currently and states it is been present just over the last month.  Patient does not smoke likes to be active   Review of Systems  All other systems reviewed and are negative.       Objective:  Physical Exam Vitals signs and nursing note reviewed.  Constitutional:      Appearance: She is well-developed.  Pulmonary:     Effort: Pulmonary effort is normal.  Musculoskeletal: Normal range of motion.  Skin:    General: Skin is warm.  Neurological:     Mental Status: She is alert.     Neurovascular status found to be intact muscle strength adequate range of motion within normal limits.  Well-healed surgical site right first metatarsal with inflammation and pain at the inner phalangeal joint right big toe mostly on the lateral side of the joint with diminished range of motion.  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

## 2019-03-31 ENCOUNTER — Other Ambulatory Visit: Payer: Self-pay | Admitting: Physician Assistant

## 2019-03-31 ENCOUNTER — Other Ambulatory Visit: Payer: Self-pay | Admitting: Internal Medicine

## 2019-03-31 DIAGNOSIS — R1011 Right upper quadrant pain: Secondary | ICD-10-CM

## 2019-03-31 DIAGNOSIS — K625 Hemorrhage of anus and rectum: Secondary | ICD-10-CM

## 2019-03-31 DIAGNOSIS — R1012 Left upper quadrant pain: Secondary | ICD-10-CM

## 2019-03-31 DIAGNOSIS — E785 Hyperlipidemia, unspecified: Secondary | ICD-10-CM

## 2019-04-01 ENCOUNTER — Other Ambulatory Visit: Payer: 59

## 2019-04-09 ENCOUNTER — Ambulatory Visit
Admission: RE | Admit: 2019-04-09 | Discharge: 2019-04-09 | Disposition: A | Discharge status: Home / Self Care | Payer: 59 | Source: Ambulatory Visit | Attending: Physician Assistant | Admitting: Physician Assistant

## 2019-04-09 ENCOUNTER — Other Ambulatory Visit: Payer: Self-pay

## 2019-04-09 DIAGNOSIS — K625 Hemorrhage of anus and rectum: Secondary | ICD-10-CM

## 2019-04-09 DIAGNOSIS — R1012 Left upper quadrant pain: Secondary | ICD-10-CM

## 2019-04-09 DIAGNOSIS — R1011 Right upper quadrant pain: Secondary | ICD-10-CM

## 2019-04-09 MED ORDER — IOPAMIDOL (ISOVUE-300) INJECTION 61%
100.0000 mL | Freq: Once | INTRAVENOUS | Status: AC | PRN
  Administered 2019-04-09: 100 mL via INTRAVENOUS

## 2019-04-11 ENCOUNTER — Ambulatory Visit (INDEPENDENT_AMBULATORY_CARE_PROVIDER_SITE_OTHER): Payer: 59 | Admitting: Neurology

## 2019-04-11 ENCOUNTER — Other Ambulatory Visit: Payer: Self-pay

## 2019-04-11 DIAGNOSIS — G243 Spasmodic torticollis: Secondary | ICD-10-CM

## 2019-04-11 MED ORDER — ONABOTULINUMTOXINA 100 UNITS IJ SOLR
260.0000 [IU] | Freq: Once | INTRAMUSCULAR | Status: AC
  Administered 2019-04-11: 260 [IU] via INTRAMUSCULAR

## 2019-04-11 NOTE — Procedures (Signed)
[facility]   Procedure Note Botox  Attending: Dr. Wells Guiles Shivali Quackenbush  Preoperative Diagnosis(es): Cervical Dystonia  Result History  Had some dysphagia after last injection  Consent obtained from: The patient Benefits discussed included, but were not limited to decreased muscle tightness, increased joint range of motion, and decreased pain.  Risk discussed included, but were not limited pain and discomfort, bleeding, bruising, excessive weakness, venous thrombosis, muscle atrophy and dysphagia.  A copy of the patient medication guide was given to the patient which explains the blackbox warning.  Patients identity and treatment sites confirmed Yes.  .  Details of Procedure: Skin was cleaned with alcohol.   Prior to injection, the needle plunger was aspirated to make sure the needle was not within a blood vessel.  There was no blood retrieved on aspiration.    Following is a summary of the muscles injected  And the amount of Botulinum toxin used:   Dilution 0.9% preservative free saline mixed with 100 u Botox type A to make 10 U per 0.1cc  Injections  Location Left  Right Units Number of sites        Sternocleidomastoid  60 60 1  Splenius Capitus, posterior approach 100  100 1  Splenius Capitus, lateral approach 40  40 1  Levator Scapulae      Trapezius  20/20/20 60 3        TOTAL UNITS:   260    Agent: Botulinum Type A ( Onobotulinum Toxin type A ).  3 vials of Botox were used, each containing 100 units and freshly diluted with 1 mL of sterile, non-preserved saline   Total injected (Units): 260  Total wasted (Units): 0   Pt tolerated procedure well without complications.   Reinjection is anticipated in 3 months.

## 2019-04-17 ENCOUNTER — Other Ambulatory Visit (HOSPITAL_COMMUNITY): Payer: Self-pay

## 2019-04-17 DIAGNOSIS — R1310 Dysphagia, unspecified: Secondary | ICD-10-CM

## 2019-04-21 ENCOUNTER — Ambulatory Visit: Payer: 59

## 2019-04-29 ENCOUNTER — Ambulatory Visit (HOSPITAL_COMMUNITY): Payer: 59

## 2019-04-29 ENCOUNTER — Encounter (HOSPITAL_COMMUNITY): Payer: Self-pay

## 2019-04-29 ENCOUNTER — Encounter (HOSPITAL_COMMUNITY): Payer: 59

## 2019-05-02 ENCOUNTER — Ambulatory Visit: Payer: 59

## 2019-05-07 ENCOUNTER — Inpatient Hospital Stay: Admission: RE | Admit: 2019-05-07 | Payer: 59 | Source: Ambulatory Visit

## 2019-05-20 DIAGNOSIS — K5900 Constipation, unspecified: Secondary | ICD-10-CM | POA: Diagnosis not present

## 2019-05-20 DIAGNOSIS — K5120 Ulcerative (chronic) proctitis without complications: Secondary | ICD-10-CM | POA: Diagnosis not present

## 2019-06-06 ENCOUNTER — Other Ambulatory Visit: Payer: Self-pay

## 2019-06-06 ENCOUNTER — Ambulatory Visit (INDEPENDENT_AMBULATORY_CARE_PROVIDER_SITE_OTHER)
Admission: RE | Admit: 2019-06-06 | Discharge: 2019-06-06 | Disposition: A | Discharge status: Home / Self Care | Payer: Self-pay | Source: Ambulatory Visit | Attending: Internal Medicine | Admitting: Internal Medicine

## 2019-06-06 DIAGNOSIS — E785 Hyperlipidemia, unspecified: Secondary | ICD-10-CM

## 2019-06-13 ENCOUNTER — Telehealth: Payer: Self-pay

## 2019-06-13 NOTE — Telephone Encounter (Signed)
Called patient no answer left message that we can discuss on Monday what was said from insurance company regarding botox approval

## 2019-06-13 NOTE — Telephone Encounter (Signed)
Received a call from [facility] regarding patient Botox approval  She states that based on there criteria and policy patient would have to have tried Dysport and Xeomin for them to approve her Botox.  Pt last approval was with [facility]. [employer] criteria is different regarding approval. She states that she will try to get a temporary  admin approval for Botox but after approval expires she will need to try the other medications.

## 2019-06-13 NOTE — Telephone Encounter (Signed)
Please let Aubreana Cornacchia know and also write it on my appointment line so that I know that we will need to change to xeomin

## 2019-06-13 NOTE — Telephone Encounter (Signed)
Will do PA for Xeomin

## 2019-06-16 DIAGNOSIS — L680 Hirsutism: Secondary | ICD-10-CM | POA: Diagnosis not present

## 2019-06-16 DIAGNOSIS — L700 Acne vulgaris: Secondary | ICD-10-CM | POA: Diagnosis not present

## 2019-06-16 DIAGNOSIS — H40023 Open angle with borderline findings, high risk, bilateral: Secondary | ICD-10-CM | POA: Diagnosis not present

## 2019-06-16 NOTE — Telephone Encounter (Signed)
Sent this to patient via mychart message per her request

## 2019-06-16 NOTE — Telephone Encounter (Signed)
Spoke with patient she is aware of the change regarding Botox to Xeomin. She would like to know what your experiences has been with Xeomin and if she should expect anything different regarding this medications. She states that she has been on Botox for a very long time and do not want to change but would like provider input regarding this medication.  She request a [employer].

## 2019-06-16 NOTE — Telephone Encounter (Signed)
They are both botox type A and while I generally use the one we were previously injecting her with and don't like to change when a patient has already had a good response to one, I have had a reasonable response to Xeomin as well.  I don't think that we have an option as they didn't give me an option to appeal as far as I know.

## 2019-06-17 NOTE — Progress Notes (Signed)
Received fax from [facility] regarding [place] for patient BOTOX  Case details: [id number] Services: Pharmacy- Medical Date of services: 06/01/2019- 12/08/2019 REF# [id number] Reason for denial: the request does not meet the definition for medical necessity found un the members benefits booklet  After review we cannot approve request for coverage for brand-name Botox for treatment of tight neck muscles (cervical dystonia)  Brand Botox is covered or cervical dystonia when: - you had an inadequate response to or can not use Xeomin and Dysport  In this case, Xeomin and Dynasport have not been used  Lorie Apley, Market researcher  Phone # [phone] Fax# [phone]

## 2019-06-18 ENCOUNTER — Telehealth: Payer: Self-pay | Admitting: Neurology

## 2019-06-18 NOTE — Telephone Encounter (Signed)
[employer] called regarding patient and her Xeomin? They are needing to know if it is Buy/Bill or a Specialty Pharmacy? They need to know how often it will be given and some clinical information. Please call [phone]. Thanks

## 2019-06-19 NOTE — Progress Notes (Signed)
Received PA from [facility] regarding patient XEOMIN  Status: APPROVED Effective dates of authorization: 06/17/19 Valid until: 06/16/2020 [phone]) Total units/ amount authorized : 4 visits  [phone] phone#  Reference # [id number]

## 2019-06-19 NOTE — Telephone Encounter (Signed)
Called spoke with Anderson Malta from [place] regarding message below. All information given A approval/denial letter will be fax to office fax [phone]

## 2019-07-11 ENCOUNTER — Ambulatory Visit: Payer: 59 | Admitting: Neurology

## 2019-07-14 NOTE — Progress Notes (Signed)
Received hard fax from [facility] for Botox The request for review of previously denied service has been reviewed date of authorization 06-11-19 through 12-08-2019  Botox [id number] APPROVED  [id number]  DISCLAIMER: please keep in mind that this is not a guarantee of payment. Eligibility and Benefits determination will be made a the time the claim is reviewed for processing  Copy sent to be scan into patient chart

## 2019-08-01 ENCOUNTER — Other Ambulatory Visit: Payer: Self-pay

## 2019-08-01 ENCOUNTER — Ambulatory Visit (INDEPENDENT_AMBULATORY_CARE_PROVIDER_SITE_OTHER): Payer: BC Managed Care – PPO | Admitting: Neurology

## 2019-08-01 DIAGNOSIS — G243 Spasmodic torticollis: Secondary | ICD-10-CM | POA: Diagnosis not present

## 2019-08-01 MED ORDER — ONABOTULINUMTOXINA 100 UNITS IJ SOLR
260.0000 [IU] | Freq: Once | INTRAMUSCULAR | Status: AC
  Administered 2019-08-01: 260 [IU] via INTRAMUSCULAR

## 2019-08-01 NOTE — Procedures (Signed)
[facility]   Procedure Note Botox  Attending: Dr. Wells Guiles Aysa Larivee  Preoperative Diagnosis(es): Cervical Dystonia  Result History  Did well after last injection  Consent obtained from: The patient Benefits discussed included, but were not limited to decreased muscle tightness, increased joint range of motion, and decreased pain.  Risk discussed included, but were not limited pain and discomfort, bleeding, bruising, excessive weakness, venous thrombosis, muscle atrophy and dysphagia.  A copy of the patient medication guide was given to the patient which explains the blackbox warning.  Patients identity and treatment sites confirmed Yes.  .  Details of Procedure: Skin was cleaned with alcohol.   Prior to injection, the needle plunger was aspirated to make sure the needle was not within a blood vessel.  There was no blood retrieved on aspiration.    Following is a summary of the muscles injected  And the amount of Botulinum toxin used:   Dilution 0.9% preservative free saline mixed with 100 u Botox type A to make 10 U per 0.1cc  Injections  Location Left  Right Units Number of sites        Sternocleidomastoid  60 60 1  Splenius Capitus, posterior approach 100  100 1  Splenius Capitus, lateral approach 40  40 1  Levator Scapulae      Trapezius  20/20/20 60 3        TOTAL UNITS:   260    Agent: Botulinum Type A ( Onobotulinum Toxin type A ).  3 vials of Botox were used, each containing 100 units and freshly diluted with 1 mL of sterile, non-preserved saline   Total injected (Units): 260  Total wasted (Units): 40   Pt tolerated procedure well without complications.   Reinjection is anticipated in 3 months.

## 2019-08-01 NOTE — Addendum Note (Signed)
Addended by: Ranae Plumber on: 08/01/2019 04:02 PM   Modules accepted: Orders

## 2019-08-11 DIAGNOSIS — H40023 Open angle with borderline findings, high risk, bilateral: Secondary | ICD-10-CM | POA: Diagnosis not present

## 2019-09-28 ENCOUNTER — Other Ambulatory Visit: Payer: Self-pay | Admitting: Internal Medicine

## 2019-09-30 MED ORDER — MECLIZINE HCL 12.5 MG PO TABS: ORAL_TABLET | ORAL | 0 refills | Status: DC

## 2019-10-01 ENCOUNTER — Other Ambulatory Visit: Payer: BC Managed Care – PPO

## 2019-10-08 DIAGNOSIS — Z1159 Encounter for screening for other viral diseases: Secondary | ICD-10-CM | POA: Diagnosis not present

## 2019-11-07 ENCOUNTER — Ambulatory Visit (INDEPENDENT_AMBULATORY_CARE_PROVIDER_SITE_OTHER): Payer: BC Managed Care – PPO | Admitting: Neurology

## 2019-11-07 ENCOUNTER — Other Ambulatory Visit: Payer: Self-pay

## 2019-11-07 DIAGNOSIS — G243 Spasmodic torticollis: Secondary | ICD-10-CM

## 2019-11-07 MED ORDER — ONABOTULINUMTOXINA 100 UNITS IJ SOLR
260.0000 [IU] | Freq: Once | INTRAMUSCULAR | Status: AC
  Administered 2019-11-07: 260 [IU] via INTRAMUSCULAR

## 2019-11-07 NOTE — Procedures (Signed)
[facility]   Procedure Note Botox  Attending: Dr. Jerusha Newel Oien  Preoperative Diagnosis(es): Cervical Dystonia  Result History  Did well after last injection  Consent obtained from: The patient Benefits discussed included, but were not limited to decreased muscle tightness, increased joint range of motion, and decreased pain.  Risk discussed included, but were not limited pain and discomfort, bleeding, bruising, excessive weakness, venous thrombosis, muscle atrophy and dysphagia.  A copy of the patient medication guide was given to the patient which explains the blackbox warning.  Patients identity and treatment sites confirmed Yes.  .  Details of Procedure: Skin was cleaned with alcohol.   Prior to injection, the needle plunger was aspirated to make sure the needle was not within a blood vessel.  There was no blood retrieved on aspiration.    Following is a summary of the muscles injected  And the amount of Botulinum toxin used:   Dilution 0.9% preservative free saline mixed with 100 u Botox type A to make 10 U per 0.1cc  Injections  Location Left  Right Units Number of sites        Sternocleidomastoid  60 60 1  Splenius Capitus, posterior approach 100  100 1  Splenius Capitus, lateral approach 40  40 1  Levator Scapulae      Trapezius  20/20/20 60 3        TOTAL UNITS:   260    Agent: Botulinum Type A ( Onobotulinum Toxin type A ).  3 vials of Botox were used, each containing 100 units and freshly diluted with 1 mL of sterile, non-preserved saline   Total injected (Units): 260  Total wasted (Units):0   Pt tolerated procedure well without complications.   Reinjection is anticipated in 3 months. 

## 2020-01-28 ENCOUNTER — Encounter: Payer: Self-pay | Admitting: *Deleted

## 2020-01-28 NOTE — Progress Notes (Addendum)
Tracy May Key: [id number] help? Call us at [phone] Status Sent to [employer] Botox 100UNIT solution Form [facility] Commercial Medical Benefit Electronic Request Form (CB)   Tracy May (Key([phone] [phone])  Your information has been submitted to [facility]. [employer] [place] will review the request and notify you of the determination decision directly, typically within 72 hours of receiving all information.  You will also receive your request decision electronically. To check for an update later, open this request again from your dashboard.  If [employer] has not responded within the specified timeframe or if you have any questions about your PA submission, contact [employer] [place] directly at [phone].   Approved 01/28/2020-01/26/2021 for 4.0 visits approval notice sent to scan into patient's chart

## 2020-02-02 NOTE — Progress Notes (Signed)
Tracy May Key: [id number] help? Call us at [phone] Outcome Approvedon May 7 Effective from 01/28/2020 through 01/26/2021. continuation for cervical dystonia Drug Botox 100UNIT solution Form [employer] [place] Commercial Medical Benefit Electronic Request Form (CB)

## 2020-02-06 ENCOUNTER — Ambulatory Visit (INDEPENDENT_AMBULATORY_CARE_PROVIDER_SITE_OTHER): Payer: 59 | Admitting: Neurology

## 2020-02-06 ENCOUNTER — Other Ambulatory Visit: Payer: Self-pay

## 2020-02-06 DIAGNOSIS — G243 Spasmodic torticollis: Secondary | ICD-10-CM | POA: Diagnosis not present

## 2020-02-06 MED ORDER — ONABOTULINUMTOXINA 100 UNITS IJ SOLR
300.0000 [IU] | Freq: Once | INTRAMUSCULAR | Status: AC
  Administered 2020-02-06: 260 [IU] via INTRAMUSCULAR

## 2020-02-06 NOTE — Procedures (Signed)
[facility]   Procedure Note Botox  Attending: Dr. Lurena Joiner Emoree Sasaki  Preoperative Diagnosis(es): Cervical Dystonia  Result History  Did well after last injection  Consent obtained from: The patient Benefits discussed included, but were not limited to decreased muscle tightness, increased joint range of motion, and decreased pain.  Risk discussed included, but were not limited pain and discomfort, bleeding, bruising, excessive weakness, venous thrombosis, muscle atrophy and dysphagia.  A copy of the patient medication guide was given to the patient which explains the blackbox warning.  Patients identity and treatment sites confirmed Yes.  .  Details of Procedure: Skin was cleaned with alcohol.   Prior to injection, the needle plunger was aspirated to make sure the needle was not within a blood vessel.  There was no blood retrieved on aspiration.    Following is a summary of the muscles injected  And the amount of Botulinum toxin used:   Dilution 0.9% preservative free saline mixed with 100 u Botox type A to make 10 U per 0.1cc  Injections  Location Left  Right Units Number of sites        Sternocleidomastoid  60 60 1  Splenius Capitus, posterior approach 100  100 1  Splenius Capitus, lateral approach 40  40 1  Levator Scapulae      Trapezius  20/20/20 60 3        TOTAL UNITS:   260    Agent: Botulinum Type A ( Onobotulinum Toxin type A ).  3 vials of Botox were used, each containing 100 units and freshly diluted with 1 mL of sterile, non-preserved saline   Total injected (Units): 260  Total wasted (Units):25   Pt tolerated procedure well without complications.   Reinjection is anticipated in 3 months.

## 2020-02-18 ENCOUNTER — Encounter: Payer: Self-pay | Admitting: *Deleted

## 2020-02-18 NOTE — Progress Notes (Signed)
Denied under pharmacy so will submit under medical via [place]isa, [place] 02/18/2020 [id number] Benefit Verification

## 2020-02-18 NOTE — Progress Notes (Addendum)
Heriberto Antigua Key: [id number] - [place] Case ID: [id number] Need help? Call us at [phone] Status Sent to [employer] Drug Botox 100UNIT solution Form [facility] Electronic Prior Authorization Form [id number] NCPDP) Kalifa Cadden Key: [id number] - [place] Case ID: [id number] Need help? Call us at [phone] Outcome Deniedtoday Request Reference Number: [id number]. BOTOX INJ 100UNIT is denied for not meeting the prior authorization requirement(s). Details of this decision are in the notice attached below or have been faxed to you. Appeals are not supported through ePA. Please refer to the fax case notice for appeals information and instructions. Drug Botox 100UNIT solution Form [facility] Electronic Prior Authorization Form [phone] NCPDP)   02/18/2020 Kerin Salen [address] [place], [place] [address] Plan member ID: [id number] Case number: [id number] Prescriber name: Raelene Trew Prescriber fax: [phone] NOTICE OF DENIAL Dear Heriberto Antigua, On behalf of [employer], [facility] is responsible for reviewing pharmacy services provided to [facility] members. We received a request from your prescriber for coverage of Botox Inj 100unit. We reviewed all of the information you and/or your doctor sent to [place] and sent the information to an appropriate physician specialist if needed. Unfortunately, we must deny coverage for Botox. Why was my request denied? This request was denied because you did not meet the following clinical requirements: The requested medication and/or diagnosis are not a covered benefit and excluded from coverage in accordance with the terms and conditions of your plan benefit. Therefore, the request has been administratively denied. The request to perform a coverage review is denied because a prior authorization cannot be performed for products excluded under the pharmacy benefit but to determine if this drug may be covered under the medical benefit you can contact  the [facility] by calling [phone]. For additional information regarding plan coverage, the member can contact Member Services by calling the number on the back of their ID card. The reason(s) [facility] did not approve this medication can be found above. This denial is based on our Botox drug coverage policy, in addition to any supplementary information you or your prescriber may have submitted. How can I obtain the material(s) used to review this request? You may request, free of charge, a copy of the drug coverage policy, actual benefit provision, guideline, protocol or other information that factored into the decision, including the diagnosis code and the treatment code and their corresponding meanings, by calling us at [phone] , or by writing to the address below: [facility] c/o Prior Authorization Guidelines [address] [place], [place] [address] Please note that this decision only affects whether your prescription plan will pay for this medication. Only you and your prescriber can decide what is best for you and your treatment. You may still buy this medication (at full cost) at your local pharmacy  I will call [facility]

## 2020-02-26 ENCOUNTER — Encounter: Payer: Self-pay | Admitting: Neurology

## 2020-02-26 NOTE — Progress Notes (Addendum)
Patient approved for Botox starting 01/24/20-09/24/98 but only good for 4 visits. Submitted directly through her medical insurance not pharmacy.

## 2020-03-02 IMAGING — US US ABDOMEN COMPLETE
1 series · 14 of 25 positions shown · non-contrast
Comparison: None.

CLINICAL DATA: Epigastric pain for 1 month.

EXAM:
ABDOMEN ULTRASOUND COMPLETE

[Series 1: us abdomen complete · 0.14mm/px · 14 of 94 slices shown]
[im 1/94]
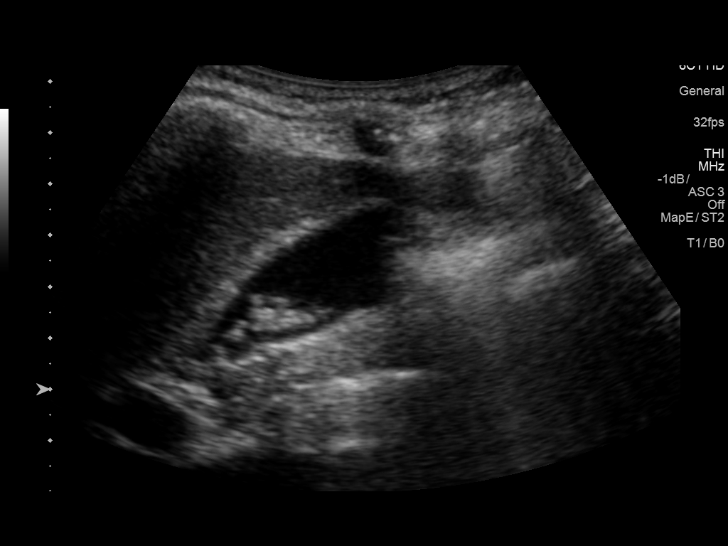
[im 8/94]
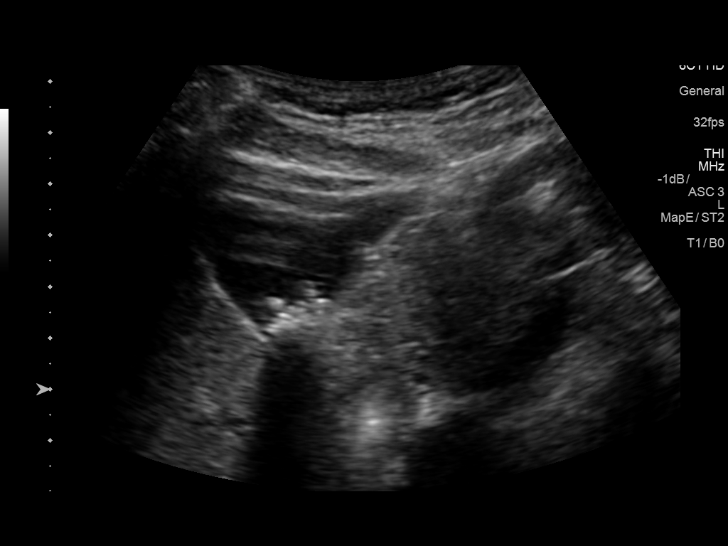
[im 16/94]
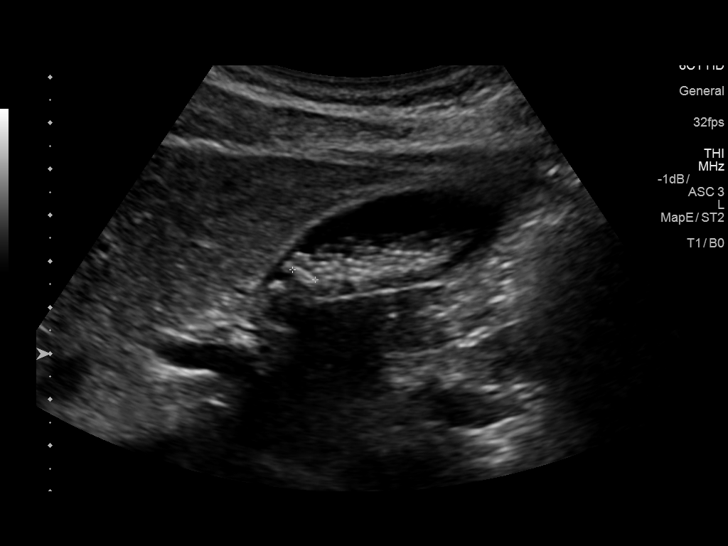
[im 24/94]
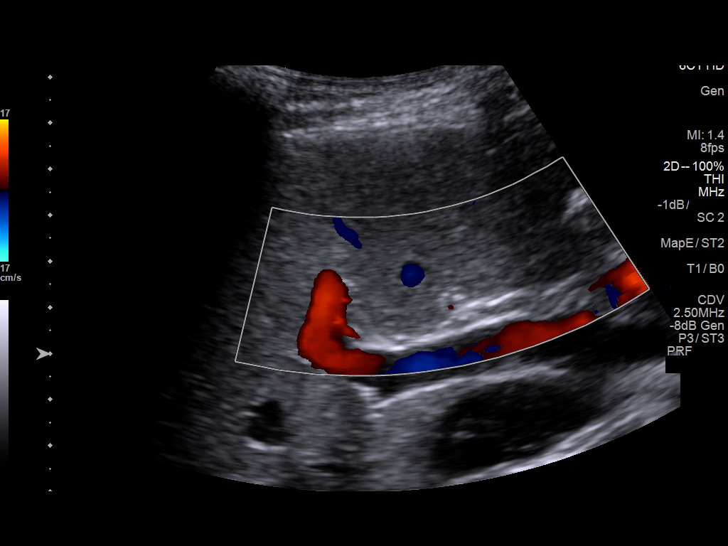
[im 32/94]
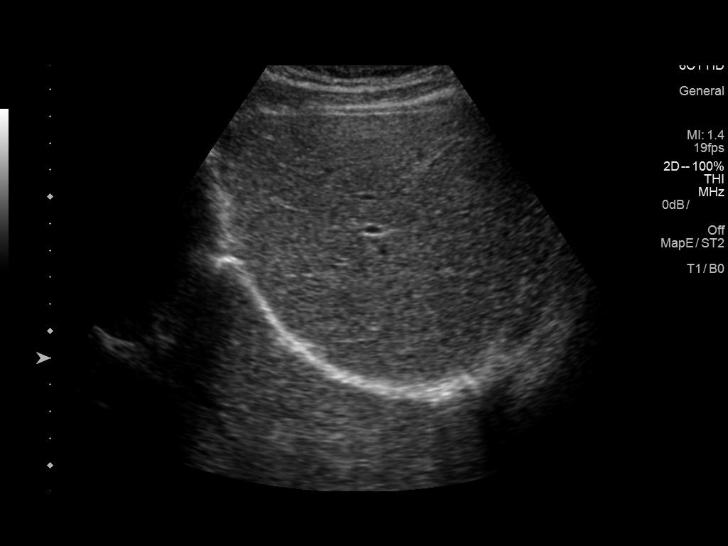
[im 35/94]
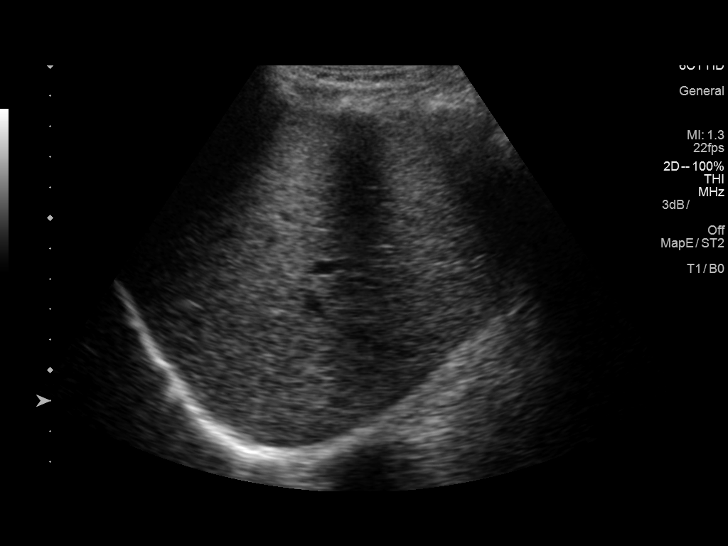
[im 43/94]
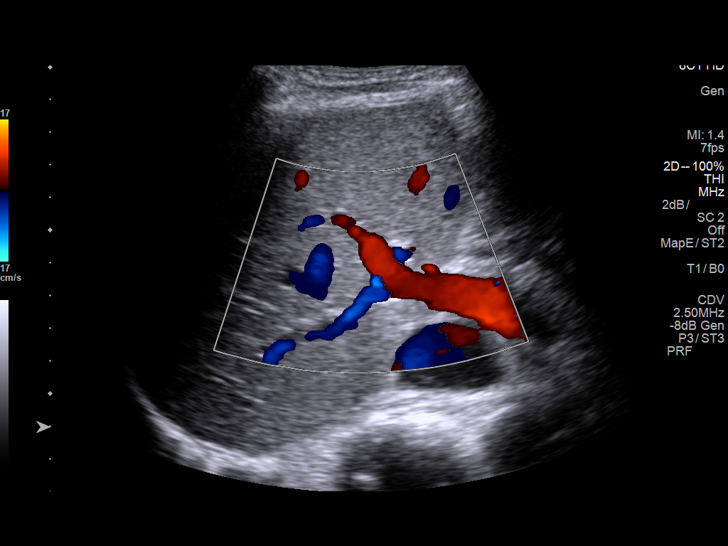
[im 51/94]
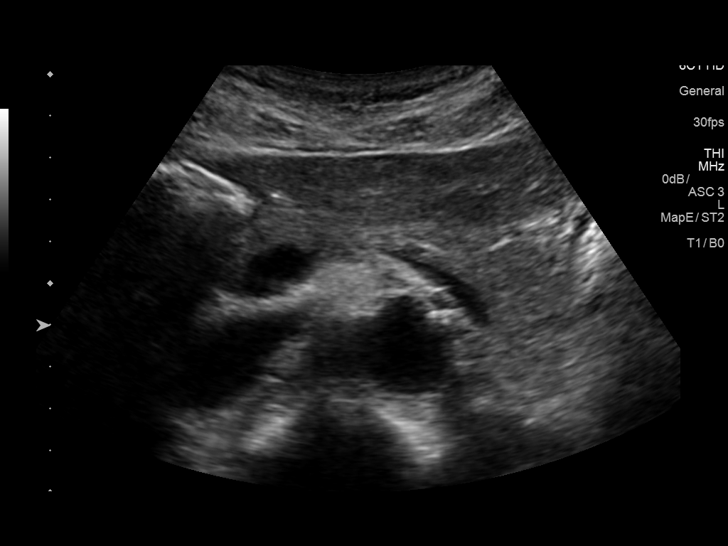
[im 59/94]
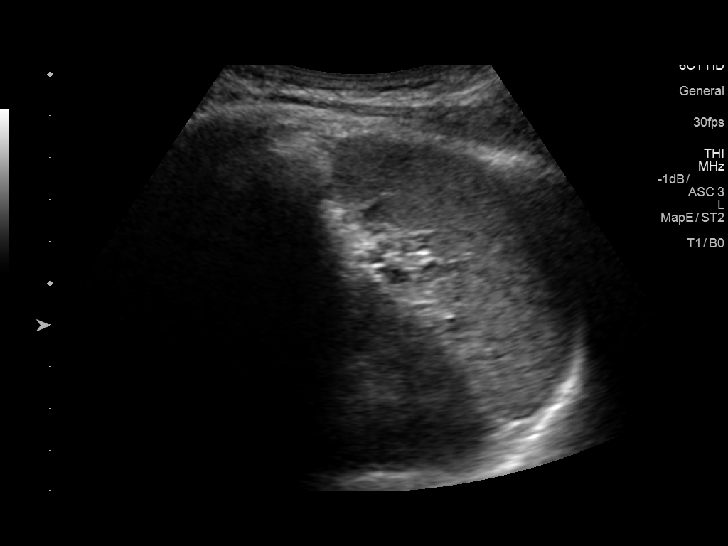
[im 63/94]
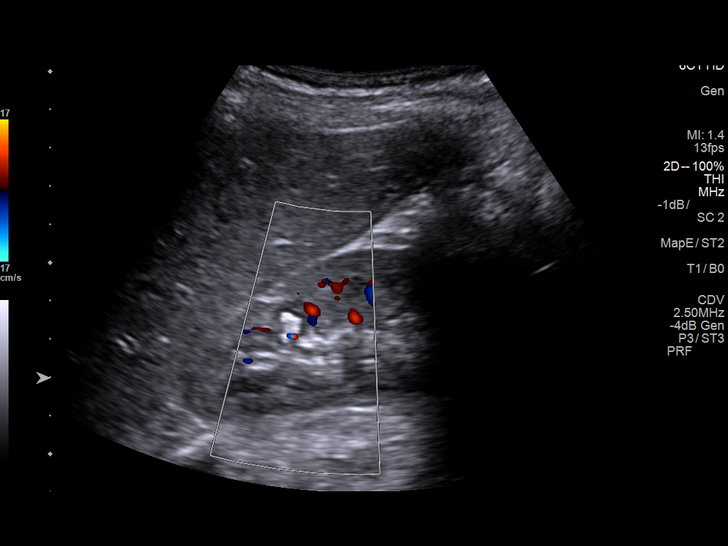
[im 70/94]
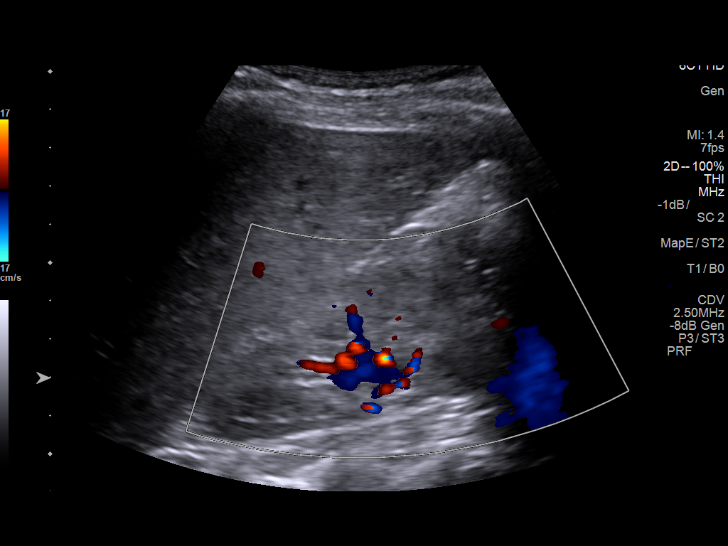
[im 78/94]
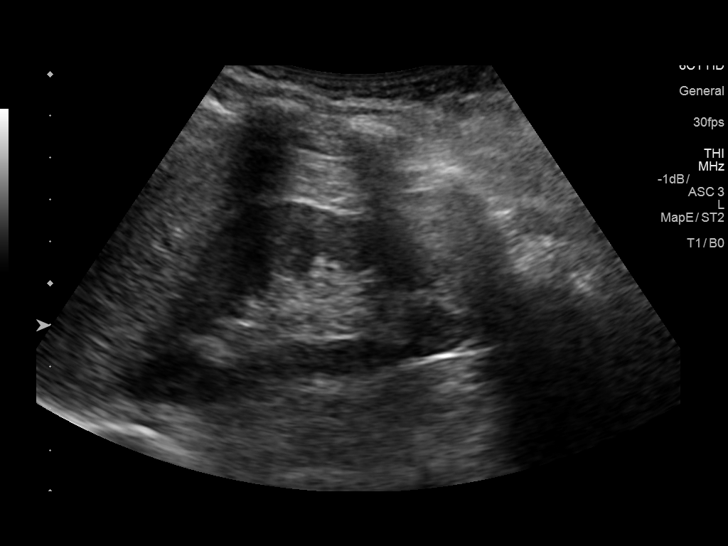
[im 86/94]
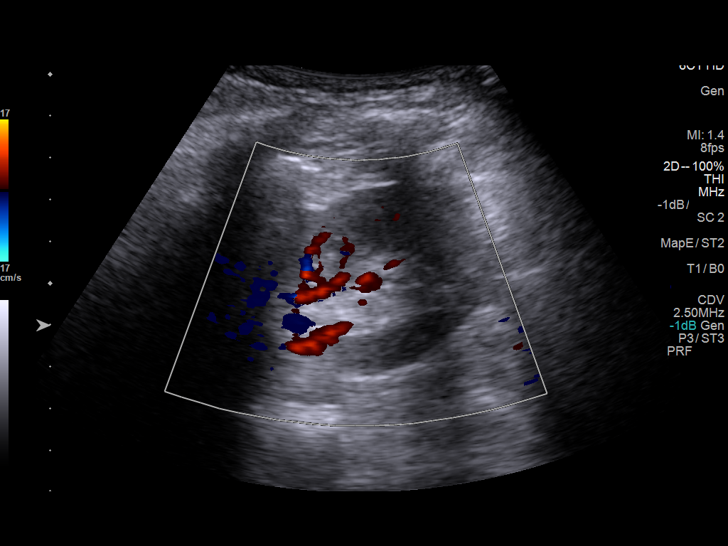
[im 94/94]
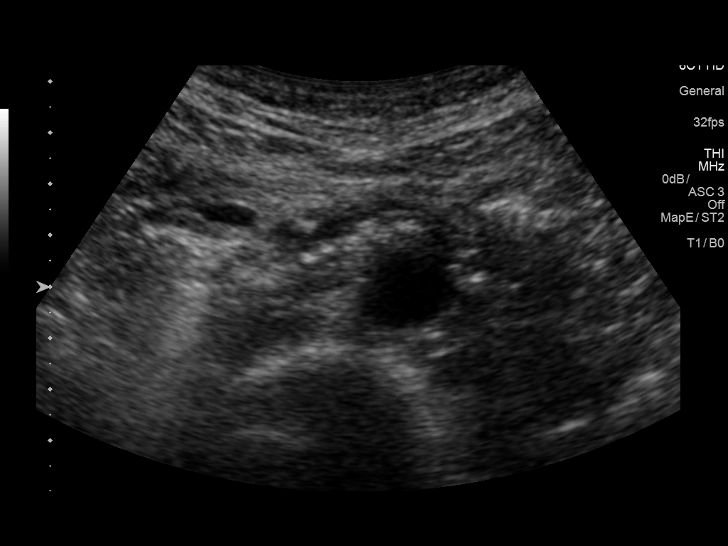

[14 of 25 positions shown; findings below may reference images not displayed]

FINDINGS: Gallbladder: Cholelithiasis with no wall thickening, pericholecystic
fluid, or Murphy's sign.

Common bile duct: Diameter: 3.2 mm

Liver: No focal lesion identified. Within normal limits in
parenchymal echogenicity. Portal vein is patent on color Doppler
imaging with normal direction of blood flow towards the liver.

IVC: No abnormality visualized.

Pancreas: Visualized portion unremarkable.

Spleen: Size and appearance within normal limits.

Right Kidney: Length: 9.55 cm. 7.6 mm non obstructive stone in the
upper right kidney. This stone is larger when compared to the CT
scan from 3332. The abnormality measured 5 mm in 8211.

Left Kidney: Length: 8.6 cm. Echogenicity within normal limits. No
mass or hydronephrosis visualized.

Abdominal aorta: No aneurysm visualized.

Other findings: None.
IMPRESSION: 1. 7.6 mm nonobstructive stone without significant shadowing in the
upper right kidney. Tiny stones were seen in this location in 3332
and a moderate-sized stone was seen in this location in 8211.
2. No other acute abnormalities.

## 2020-04-06 ENCOUNTER — Other Ambulatory Visit: Payer: Self-pay

## 2020-04-06 ENCOUNTER — Encounter: Payer: Self-pay | Admitting: Internal Medicine

## 2020-04-06 ENCOUNTER — Ambulatory Visit (INDEPENDENT_AMBULATORY_CARE_PROVIDER_SITE_OTHER): Payer: 59 | Admitting: Internal Medicine

## 2020-04-06 VITALS — BP 126/82 | HR 67 | Temp 98.1°F | Ht 64.0 in | Wt 148.0 lb

## 2020-04-06 DIAGNOSIS — Z0000 Encounter for general adult medical examination without abnormal findings: Secondary | ICD-10-CM | POA: Diagnosis not present

## 2020-04-06 DIAGNOSIS — J4520 Mild intermittent asthma, uncomplicated: Secondary | ICD-10-CM

## 2020-04-06 MED ORDER — MECLIZINE HCL 12.5 MG PO TABS: ORAL_TABLET | ORAL | 1 refills | Status: AC

## 2020-04-06 NOTE — Progress Notes (Signed)
Subjective:  Patient ID: Tracy May, female    DOB: [birth year]  Age: 64 y.o. MRN: [id number]  CC: No chief complaint on file.   HPI [name] presents for a well exam  Outpatient Medications Prior to Visit  Medication Sig Dispense Refill  . acetaminophen (APAP EXTRA STRENGTH) 500 MG tablet Take 1,000 mg by mouth every 6 (six) hours as needed for moderate pain or headache.     . Azelaic Acid (FINACEA) 15 % cream Apply 1 application topically 2 (two) times daily as needed (for acne). After skin is thoroughly washed and patted dry, gently but thoroughly massage a thin film of azelaic acid cream into the affected area twice daily, in the morning and evening.     Marland Kitchen b complex vitamins tablet Take 1 tablet by mouth daily. 100 tablet 3  . Biotin (BIOTIN MAXIMUM STRENGTH) 5 MG CAPS Take 10,000 mg by mouth every Monday, Wednesday, and Friday.     . cetirizine (ZYRTEC) 10 MG tablet Take 10 mg by mouth daily as needed for allergies.     . Cholecalciferol 2000 UNITS TABS Take 2,000 Units by mouth daily.     Marland Kitchen estradiol (MINIVELLE) 0.05 MG/24HR patch Place 1 patch onto the skin 2 (two) times a week.    . Magnesium 250 MG TABS Take 250 mg by mouth daily.     . meloxicam (MOBIC) 15 MG tablet TAKE 1 TABLET(15 MG) BY MOUTH DAILY AS NEEDED FOR PAIN 90 tablet 1  . tretinoin (RETIN-A) 0.025 % cream Apply 1 application topically at bedtime as needed (for acne).    . Turmeric (QC TUMERIC COMPLEX PO) Take 1 capsule by mouth daily.    Marland Kitchen zolpidem (AMBIEN) 10 MG tablet Take 1 tablet (10 mg total) by mouth at bedtime as needed. for sleep (Patient taking differently: Take 10 mg by mouth at bedtime. ) 30 tablet 3  . meclizine (ANTIVERT) 12.5 MG tablet TAKE 1 TABLET(12.5 MG) BY MOUTH DAILY AS NEEDED FOR DIZZINESS 30 tablet 0  . clonazePAM (KLONOPIN) 0.5 MG tablet Take 0.5 tablets (0.25 mg total) by mouth 2 (two) times daily as needed for anxiety. (Patient not taking: Reported on 04/06/2020) 90 tablet 0   No  facility-administered medications prior to visit.    ROS: Review of Systems  Constitutional: Negative for activity change, appetite change, chills, fatigue and unexpected weight change.  HENT: Negative for congestion, mouth sores and sinus pressure.   Eyes: Negative for visual disturbance.  Respiratory: Negative for cough and chest tightness.   Gastrointestinal: Negative for abdominal pain and nausea.  Genitourinary: Negative for difficulty urinating, frequency and vaginal pain.  Musculoskeletal: Negative for back pain and gait problem.  Skin: Negative for pallor and rash.  Neurological: Positive for tremors. Negative for dizziness, weakness, numbness and headaches.  Psychiatric/Behavioral: Negative for confusion and sleep disturbance. The patient is not nervous/anxious.     Objective:  BP 126/82 (BP Location: Left Arm, Patient Position: Sitting, Cuff Size: Normal)   Pulse 67   Temp 98.1 F (36.7 C) (Oral)   Ht 5\' 4"  (1.626 m)   Wt 148 lb (67.1 kg)   SpO2 97%   BMI 25.40 kg/m   BP Readings from Last 3 Encounters:  04/06/20 126/82  03/26/19 127/76  03/24/19 122/80    Wt Readings from Last 3 Encounters:  04/06/20 148 lb (67.1 kg)  03/24/19 139 lb (63 kg)  03/21/18 134 lb (60.8 kg)    Physical Exam Constitutional:  General: She is not in acute distress.    Appearance: She is well-developed.  HENT:     Head: Normocephalic.     Right Ear: External ear normal.     Left Ear: External ear normal.     Nose: Nose normal.  Eyes:     General:        Right eye: No discharge.        Left eye: No discharge.     Conjunctiva/sclera: Conjunctivae normal.     Pupils: Pupils are equal, round, and reactive to light.  Neck:     Thyroid: No thyromegaly.     Vascular: No JVD.     Trachea: No tracheal deviation.  Cardiovascular:     Rate and Rhythm: Normal rate and regular rhythm.     Heart sounds: Normal heart sounds.  Pulmonary:     Effort: No respiratory distress.      Breath sounds: No stridor. No wheezing.  Abdominal:     General: Bowel sounds are normal. There is no distension.     Palpations: Abdomen is soft. There is no mass.     Tenderness: There is no abdominal tenderness. There is no guarding or rebound.  Musculoskeletal:        General: No tenderness.     Cervical back: Normal range of motion and neck supple.  Lymphadenopathy:     Cervical: No cervical adenopathy.  Skin:    Findings: No erythema or rash.  Neurological:     Cranial Nerves: No cranial nerve deficit.     Motor: No abnormal muscle tone.     Coordination: Coordination normal.     Deep Tendon Reflexes: Reflexes normal.  Psychiatric:        Behavior: Behavior normal.        Thought Content: Thought content normal.        Judgment: Judgment normal.     Lab Results  Component Value Date   WBC 5.1 03/26/2019   HGB 13.7 03/26/2019   HCT 40.2 03/26/2019   PLT 319.0 03/26/2019   GLUCOSE 88 03/26/2019   CHOL 205 (H) 03/26/2019   TRIG 30.0 03/26/2019   HDL 79.80 03/26/2019   LDLDIRECT 102.3 10/16/2012   LDLCALC 119 (H) 03/26/2019   ALT 19 03/26/2019   AST 17 03/26/2019   NA 138 03/26/2019   K 4.2 03/26/2019   CL 104 03/26/2019   CREATININE 0.79 03/26/2019   BUN 9 03/26/2019   CO2 29 03/26/2019   TSH 1.38 03/26/2019    CT CARDIAC SCORING  Addendum Date: 06/06/2019   ADDENDUM REPORT: 06/06/2019 12:42 CLINICAL DATA:  Risk stratification EXAM: Coronary Calcium Score TECHNIQUE: The patient was scanned on a [employer] scanner. Axial non-contrast 3 mm slices were carried out through the heart. The data set was analyzed on a dedicated work station and scored using the Agatson method. FINDINGS: Non-cardiac: See separate report from [facility]. Ascending Aorta: Normal size, no calcifications. Pericardium: Normal. Coronary arteries: Normal origin. IMPRESSION: Coronary calcium score of 0. This was 0 percentile for age and sex matched control. Electronically Signed   By:  Tobias Alexander   On: 06/06/2019 12:42   Result Date: 06/06/2019 EXAM: OVER-READ INTERPRETATION  CT CHEST The following report is an over-read performed by radiologist Dr. Trudie Reed of [facility] on 06/06/2019. This over-read does not include interpretation of cardiac or coronary anatomy or pathology. The coronary calcium score interpretation by the cardiologist is attached. COMPARISON:  None. FINDINGS: Parenchymal calcifications  in the posterior basal segment of the right lower lobe, likely sequela of remote infection. Within the visualized portions of the thorax there are no suspicious appearing pulmonary nodules or masses, there is no acute consolidative airspace disease, no pleural effusions, no pneumothorax and no lymphadenopathy. Visualized portions of the upper abdomen are unremarkable. There are no aggressive appearing lytic or blastic lesions noted in the visualized portions of the skeleton. IMPRESSION: No significant incidental noncardiac findings are noted. Electronically Signed: By: Trudie Reed M.D. On: 06/06/2019 09:32    Assessment & Plan:   There are no diagnoses linked to this encounter.   Meds ordered this encounter  Medications  . meclizine (ANTIVERT) 12.5 MG tablet    Sig: TAKE 1 TABLET(12.5 MG) BY MOUTH DAILY AS NEEDED FOR DIZZINESS    Dispense:  30 tablet    Refill:  1     Follow-up: No follow-ups on file.  Sonda Primes, MD

## 2020-04-06 NOTE — Assessment & Plan Note (Signed)
Doing well 

## 2020-04-06 NOTE — Assessment & Plan Note (Signed)
We discussed age appropriate health related issues, including available/recomended screening tests and vaccinations. We discussed a need for adhering to healthy diet and exercise. Labs were ordered to be later reviewed . All questions were answered. Colon 2019 CT coronary calcium score is 0 - 9/20

## 2020-04-07 LAB — LIPID PANEL
Cholesterol: 232 mg/dL — ABNORMAL HIGH (ref <200)
HDL: 87 mg/dL (ref >=50)
LDL Cholesterol (Calc): 131 mg/dL (calc) — ABNORMAL HIGH
Non-HDL Cholesterol (Calc): 145 mg/dL (calc) — ABNORMAL HIGH (ref <130)
Total CHOL/HDL Ratio: 2.7 (calc) (ref <5.0)
Triglycerides: 45 mg/dL (ref <150)

## 2020-04-07 LAB — CBC WITH DIFFERENTIAL/PLATELET
Absolute Monocytes: 448 cells/uL (ref 200–950)
Basophils Absolute: 28 cells/uL (ref 0–200)
Basophils Relative: 0.5 %
Eosinophils Absolute: 112 cells/uL (ref 15–500)
Eosinophils Relative: 2 %
HCT: 42.3 % (ref 35.0–45.0)
Hemoglobin: 14.1 g/dL (ref 11.7–15.5)
Lymphs Abs: 1428 cells/uL (ref 850–3900)
MCH: 31.8 pg (ref 27.0–33.0)
MCHC: 33.3 g/dL (ref 32.0–36.0)
MCV: 95.5 fL (ref 80.0–100.0)
MPV: 9.2 fL (ref 7.5–12.5)
Monocytes Relative: 8 %
Neutro Abs: 3584 cells/uL (ref 1500–7800)
Neutrophils Relative %: 64 %
Platelets: 325 10*3/uL (ref 140–400)
RBC: 4.43 10*6/uL (ref 3.80–5.10)
RDW: 11.5 % (ref 11.0–15.0)
Total Lymphocyte: 25.5 %
WBC: 5.6 10*3/uL (ref 3.8–10.8)

## 2020-04-07 LAB — HEPATIC FUNCTION PANEL
AG Ratio: 1.9 (calc) (ref 1.0–2.5)
ALT: 15 U/L (ref 6–29)
AST: 16 U/L (ref 10–35)
Albumin: 4.3 g/dL (ref 3.6–5.1)
Alkaline phosphatase (APISO): 49 U/L (ref 37–153)
Bilirubin, Direct: 0.1 mg/dL (ref 0.0–0.2)
Globulin: 2.3 g/dL (calc) (ref 1.9–3.7)
Indirect Bilirubin: 0.3 mg/dL (calc) (ref 0.2–1.2)
Total Bilirubin: 0.4 mg/dL (ref 0.2–1.2)
Total Protein: 6.6 g/dL (ref 6.1–8.1)

## 2020-04-07 LAB — BASIC METABOLIC PANEL
BUN: 12 mg/dL (ref 7–25)
CO2: 29 mmol/L (ref 20–32)
Calcium: 9.6 mg/dL (ref 8.6–10.4)
Chloride: 101 mmol/L (ref 98–110)
Creat: 0.86 mg/dL (ref 0.50–0.99)
Glucose, Bld: 87 mg/dL (ref 65–99)
Potassium: 4.5 mmol/L (ref 3.5–5.3)
Sodium: 137 mmol/L (ref 135–146)

## 2020-04-07 LAB — URINALYSIS
Bilirubin Urine: NEGATIVE
Glucose, UA: NEGATIVE
Hgb urine dipstick: NEGATIVE
Ketones, ur: NEGATIVE
Leukocytes,Ua: NEGATIVE
Nitrite: NEGATIVE
Protein, ur: NEGATIVE
Specific Gravity, Urine: 1.007 (ref 1.001–1.03)
pH: 8 (ref 5.0–8.0)

## 2020-04-07 LAB — TSH: TSH: 1 mIU/L (ref 0.40–4.50)

## 2020-05-04 ENCOUNTER — Encounter: Payer: Self-pay | Admitting: Neurology

## 2020-05-04 NOTE — Progress Notes (Addendum)
BCBS ID#: [id number]; new ins info just incase.  Submitted BV through [employer] and bill or spec pharm ([facility] @ [phone])  [place] Approval: Approved for 4 visits- from 05/18/20 to 05/17/21.

## 2020-05-07 ENCOUNTER — Ambulatory Visit: Payer: BC Managed Care – PPO | Admitting: Neurology

## 2020-06-04 ENCOUNTER — Ambulatory Visit: Payer: Self-pay | Admitting: Neurology

## 2020-06-04 ENCOUNTER — Ambulatory Visit (INDEPENDENT_AMBULATORY_CARE_PROVIDER_SITE_OTHER): Payer: BC Managed Care – PPO | Admitting: Neurology

## 2020-06-04 ENCOUNTER — Other Ambulatory Visit: Payer: Self-pay

## 2020-06-04 DIAGNOSIS — G243 Spasmodic torticollis: Secondary | ICD-10-CM

## 2020-06-04 MED ORDER — ONABOTULINUMTOXINA 100 UNITS IJ SOLR
260.0000 [IU] | Freq: Once | INTRAMUSCULAR | Status: AC
  Administered 2020-06-04: 260 [IU] via INTRAMUSCULAR

## 2020-06-04 NOTE — Procedures (Signed)
[facility]   Procedure Note Botox  Attending: Dr. Genell Terrill Alperin  Preoperative Diagnosis(es): Cervical Dystonia  Result History  Did well after last injection  Consent obtained from: The patient Benefits discussed included, but were not limited to decreased muscle tightness, increased joint range of motion, and decreased pain.  Risk discussed included, but were not limited pain and discomfort, bleeding, bruising, excessive weakness, venous thrombosis, muscle atrophy and dysphagia.  A copy of the patient medication guide was given to the patient which explains the blackbox warning.  Patients identity and treatment sites confirmed Yes.  .  Details of Procedure: Skin was cleaned with alcohol.   Prior to injection, the needle plunger was aspirated to make sure the needle was not within a blood vessel.  There was no blood retrieved on aspiration.    Following is a summary of the muscles injected  And the amount of Botulinum toxin used:   Dilution 0.9% preservative free saline mixed with 100 u Botox type A to make 10 U per 0.1cc  Injections  Location Left  Right Units Number of sites        Sternocleidomastoid  60 60 1  Splenius Capitus, posterior approach 100  100 1  Splenius Capitus, lateral approach 40  40 1  Levator Scapulae      Trapezius  20/20/20 60 3        TOTAL UNITS:   260    Agent: Botulinum Type A ( Onobotulinum Toxin type A ).  3 vials of Botox were used, each containing 100 units and freshly diluted with 1 mL of sterile, non-preserved saline   Total injected (Units): 260  Total wasted (Units):0   Pt tolerated procedure well without complications.   Reinjection is anticipated in 3 months. 

## 2020-06-15 DIAGNOSIS — L680 Hirsutism: Secondary | ICD-10-CM | POA: Diagnosis not present

## 2020-06-15 DIAGNOSIS — L301 Dyshidrosis [pompholyx]: Secondary | ICD-10-CM | POA: Diagnosis not present

## 2020-06-15 DIAGNOSIS — L700 Acne vulgaris: Secondary | ICD-10-CM | POA: Diagnosis not present

## 2020-08-06 ENCOUNTER — Ambulatory Visit: Payer: Self-pay | Admitting: Neurology

## 2020-08-24 ENCOUNTER — Telehealth: Payer: Self-pay

## 2020-08-24 NOTE — Telephone Encounter (Signed)
Called and spoke to patient regarding Botox. Patient's current insurance plans requires patient to meet $6000.00 deductible before they will pay towards her Botox injections. Patient is not able to pay this out of pocket. Patient was asked to call insurance company and verify any patient assistance programs that would be available to her as we were informed that she may be eligible for two different programs. Patient will call me back and verify with me what options are available and then we can assist her with how to move forward with her Botox injections.Patient verbalized understanding and stated that he had already planned to call her insurance company today and verify any options for assistance that may be available.

## 2020-09-06 DIAGNOSIS — M79645 Pain in left finger(s): Secondary | ICD-10-CM | POA: Diagnosis not present

## 2020-09-06 DIAGNOSIS — M13842 Other specified arthritis, left hand: Secondary | ICD-10-CM | POA: Diagnosis not present

## 2020-09-06 DIAGNOSIS — M79644 Pain in right finger(s): Secondary | ICD-10-CM | POA: Diagnosis not present

## 2020-09-07 NOTE — Progress Notes (Addendum)
Per phone call with Rep from [facility] the [facility]ty [facility] for patient should be [facility] since patient is out of network with them. Will call [facility] at [phone] to set up account.    1/27- set up acct and gave verbal for Botox to [facility].  1/28- per [facility] patient needs to use buy and bill.

## 2020-09-09 ENCOUNTER — Other Ambulatory Visit: Payer: Self-pay

## 2020-09-09 ENCOUNTER — Ambulatory Visit (INDEPENDENT_AMBULATORY_CARE_PROVIDER_SITE_OTHER): Payer: BC Managed Care – PPO | Admitting: Neurology

## 2020-09-09 DIAGNOSIS — G243 Spasmodic torticollis: Secondary | ICD-10-CM | POA: Diagnosis not present

## 2020-09-09 MED ORDER — ONABOTULINUMTOXINA 100 UNITS IJ SOLR
260.0000 [IU] | Freq: Once | INTRAMUSCULAR | Status: AC
  Administered 2020-09-09: 09:00:00 260 [IU] via INTRAMUSCULAR

## 2020-09-09 NOTE — Procedures (Signed)
[facility]   Procedure Note Botox  Attending: Dr. Amyrie Donnis Pecha  Preoperative Diagnosis(es): Cervical Dystonia  Result History  Did well after last injection  Consent obtained from: The patient Benefits discussed included, but were not limited to decreased muscle tightness, increased joint range of motion, and decreased pain.  Risk discussed included, but were not limited pain and discomfort, bleeding, bruising, excessive weakness, venous thrombosis, muscle atrophy and dysphagia.  A copy of the patient medication guide was given to the patient which explains the blackbox warning.  Patients identity and treatment sites confirmed Yes.  .  Details of Procedure: Skin was cleaned with alcohol.   Prior to injection, the needle plunger was aspirated to make sure the needle was not within a blood vessel.  There was no blood retrieved on aspiration.    Following is a summary of the muscles injected  And the amount of Botulinum toxin used:   Dilution 0.9% preservative free saline mixed with 100 u Botox type A to make 10 U per 0.1cc  Injections  Location Left  Right Units Number of sites        Sternocleidomastoid  60 60 1  Splenius Capitus, posterior approach 100  100 1  Splenius Capitus, lateral approach 40  40 1  Levator Scapulae      Trapezius  20/20/20 60 3        TOTAL UNITS:   260    Agent: Botulinum Type A ( Onobotulinum Toxin type A ).  3 vials of Botox were used, each containing 100 units and freshly diluted with 1 mL of sterile, non-preserved saline   Total injected (Units): 260  Total wasted (Units):0   Pt tolerated procedure well without complications.   Reinjection is anticipated in 3 months. 

## 2020-09-10 DIAGNOSIS — K5904 Chronic idiopathic constipation: Secondary | ICD-10-CM | POA: Diagnosis not present

## 2020-09-10 DIAGNOSIS — K5120 Ulcerative (chronic) proctitis without complications: Secondary | ICD-10-CM | POA: Diagnosis not present

## 2020-10-08 ENCOUNTER — Other Ambulatory Visit: Payer: Self-pay

## 2020-10-08 ENCOUNTER — Ambulatory Visit: Payer: BC Managed Care – PPO

## 2020-10-08 ENCOUNTER — Ambulatory Visit (INDEPENDENT_AMBULATORY_CARE_PROVIDER_SITE_OTHER): Payer: BC Managed Care – PPO | Admitting: General Practice

## 2020-10-08 DIAGNOSIS — Z23 Encounter for immunization: Secondary | ICD-10-CM | POA: Diagnosis not present

## 2020-10-21 ENCOUNTER — Telehealth: Payer: Self-pay | Admitting: Neurology

## 2020-10-21 NOTE — Telephone Encounter (Signed)
Patient called in stating she got a message [facility] and wanted to make sure her next Botox is going to be buy and bill? Her insurance will only pay that way, otherwise it will go all to deductible.

## 2020-10-22 NOTE — Telephone Encounter (Signed)
Called patient and spoke with her. Everything is situated. Thanks!

## 2020-10-26 DIAGNOSIS — R300 Dysuria: Secondary | ICD-10-CM | POA: Diagnosis not present

## 2020-12-03 ENCOUNTER — Ambulatory Visit: Payer: BC Managed Care – PPO | Admitting: Internal Medicine

## 2020-12-03 ENCOUNTER — Encounter: Payer: Self-pay | Admitting: Internal Medicine

## 2020-12-03 ENCOUNTER — Other Ambulatory Visit: Payer: Self-pay

## 2020-12-03 VITALS — BP 128/90 | HR 62 | Ht 64.0 in | Wt 160.0 lb

## 2020-12-03 DIAGNOSIS — E042 Nontoxic multinodular goiter: Secondary | ICD-10-CM | POA: Diagnosis not present

## 2020-12-03 NOTE — Patient Instructions (Signed)
Please come back in 2 years.  I will order a new thyroid U/S for you.

## 2020-12-03 NOTE — Progress Notes (Addendum)
Patient ID: Tracy May, female   DOB: [birth year], 65 y.o.   MRN: [id number]   This visit occurred during the SARS-CoV-2 public health emergency.  Safety protocols were in place, including screening questions prior to the visit, additional usage of staff PPE, and extensive cleaning of exam room while observing appropriate contact time as indicated for disinfecting solutions.   HPI  Tracy May is a 65 y.o.-year-old female, resenting for follow-up for thyroid nodules.  Last visit 1 year and 10 months ago (virtual).  Reviewed her history: Patient has a head tremor >> referred to neurology >> dx of cervical dystonia >> started Botox injections.  Part of the investigation for her neck dystonia >> cervical MRI, which showed a possible thyroid nodule.  A thyroid ultrasound obtained subsequently showed bilateral thyroid nodules.  Thyroid U/S (01/01/2017): 1) - 1.5 cm poorly defined right thyroid nodule meets criteria for biopsy. 2) - 2.6 cm left thyroid nodule meets criteria for biopsy.  The 2 nodules were Bx'ed (01/09/2017): 1) - scant epithelial sample >> inconclusive result 2) - benign follicular nodule  The inconclusive biopsy was repeated (07/10/2017): benign  Pt denies: - feeling nodules in neck - hoarseness - dysphagia -only has this after getting Botox injection for cervical dystonia (she gets these every 3 months) - choking - SOB with lying down  Her TFTs have been normal: Lab Results  Component Value Date   TSH 1.00 04/06/2020   TSH 1.38 03/26/2019   TSH 0.92 02/01/2018   TSH 1.19 11/10/2016   TSH 1.42 09/17/2015   TSH 0.70 08/26/2014   TSH 0.47 10/16/2012   TSH 0.66 09/15/2011   TSH 0.50 09/09/2010   TSH 0.92 12/13/2009    + FH of thyroid ds  -maternal aunt -? thyroid cancer. No FH of thyroid cancer. No h/o radiation tx to head or neck.  No herbal supplements.  On biotin 10,000 mcg 3x a week. No recent steroids use.   She also has a history of BPPV,  hyperlipidemia.  She was previously on the [employer].  ROS: Constitutional: no weight gain/no weight loss, no fatigue, no subjective hyperthermia, no subjective hypothermia Eyes: no blurry vision, no xerophthalmia ENT: no sore throat, + see HPI Cardiovascular: no CP/no SOB/no palpitations/no leg swelling Respiratory: no cough/no SOB/no wheezing Gastrointestinal: no N/no V/no D/no C/no acid reflux Musculoskeletal: no muscle aches/no joint aches Skin: no rashes, no hair loss Neurological: + head tremor before her Botox injections/no numbness/no tingling/no dizziness  I reviewed pt's medications, allergies, PMH, social hx, family hx, and changes were documented in the history of present illness. Otherwise, unchanged from my initial visit note.  Past Medical History:  Diagnosis Date  . Acne   . Allergic rhinitis   . Cervical dystonia 2017  . GERD (gastroesophageal reflux disease)   . PONV (postoperative nausea and vomiting)   . Thyroid nodule 12/2016  . Vertigo    Past Surgical History:  Procedure Laterality Date  . ABDOMINAL HYSTERECTOMY    . BUNIONECTOMY Right   . CHOLECYSTECTOMY N/A 03/13/2018   Procedure: LAPAROSCOPIC CHOLECYSTECTOMY;  Surgeon: Berna Bue, MD;  Location: [facility];  Service: General;  Laterality: N/A;  . COLONOSCOPY    . ECTOPIC PREGNANCY SURGERY    . MYOMECTOMY    . WISDOM TOOTH EXTRACTION     Social History   Social History  . Marital status: Married    Spouse name: N/A  . Number of children: 1   Occupational History  .  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 Smoking status: Former Smoker    Quit date: 11/13/1996  . Smokeless tobacco: Never Used  . Alcohol use Yes     Comment:1-4  glass of wine a month  . Drug use: No   Social History Narrative   Regular Exercise- yes   Current Outpatient Medications on File Prior to Visit  Medication Sig Dispense Refill  . acetaminophen (APAP EXTRA STRENGTH) 500 MG  tablet Take 1,000 mg by mouth every 6 (six) hours as needed for moderate pain or headache.     . Azelaic Acid (FINACEA) 15 % cream Apply 1 application topically 2 (two) times daily as needed (for acne). After skin is thoroughly washed and patted dry, gently but thoroughly massage a thin film of azelaic acid cream into the affected area twice daily, in the morning and evening.     Marland Kitchen b complex vitamins tablet Take 1 tablet by mouth daily. 100 tablet 3  . Biotin (BIOTIN MAXIMUM STRENGTH) 5 MG CAPS Take 10,000 mg by mouth every Monday, Wednesday, and Friday.     . cetirizine (ZYRTEC) 10 MG tablet Take 10 mg by mouth daily as needed for allergies.     . Cholecalciferol 2000 UNITS TABS Take 2,000 Units by mouth daily.     . clonazePAM (KLONOPIN) 0.5 MG tablet Take 0.5 tablets (0.25 mg total) by mouth 2 (two) times daily as needed for anxiety. (Patient not taking: Reported on 04/06/2020) 90 tablet 0  . estradiol (MINIVELLE) 0.05 MG/24HR patch Place 1 patch onto the skin 2 (two) times a week.    . Magnesium 250 MG TABS Take 250 mg by mouth daily.     . meclizine (ANTIVERT) 12.5 MG tablet TAKE 1 TABLET(12.5 MG) BY MOUTH DAILY AS NEEDED FOR DIZZINESS 30 tablet 1  . meloxicam (MOBIC) 15 MG tablet TAKE 1 TABLET(15 MG) BY MOUTH DAILY AS NEEDED FOR PAIN 90 tablet 1  . tretinoin (RETIN-A) 0.025 % cream Apply 1 application topically at bedtime as needed (for acne).    . Turmeric (QC TUMERIC COMPLEX PO) Take 1 capsule by mouth daily.    Marland Kitchen zolpidem (AMBIEN) 10 MG tablet Take 1 tablet (10 mg total) by mouth at bedtime as needed. for sleep (Patient taking differently: Take 10 mg by mouth at bedtime. ) 30 tablet 3   No current facility-administered medications on file prior to visit.   Allergies  Allergen Reactions  . Azithromycin Nausea Only and Other (See Comments)    Stomach pains  . Sulfonamide Derivatives Itching   Family History  Problem Relation Age of Onset  . Cancer Mother        neck  . Cancer Sister         glioblastoma  . Diabetes Father   . Lung cancer Father    PE: BP 128/90 (BP Location: Right Arm, Patient Position: Sitting, Cuff Size: Normal)   Pulse 62   Ht 5\' 4"  (1.626 m)   Wt 160 lb (72.6 kg)   SpO2 95%   BMI 27.46 kg/m  Wt Readings from Last 3 Encounters:  12/03/20 160 lb (72.6 kg)  04/06/20 148 lb (67.1 kg)  03/24/19 139 lb (63 kg)   Constitutional: normal weight, in NAD Eyes: PERRLA, EOMI, no exophthalmos ENT: moist mucous membranes, no thyromegaly, no cervical lymphadenopathy Cardiovascular: RRR, No MRG Respiratory: CTA B Gastrointestinal: abdomen soft, NT, ND, BS+ Musculoskeletal: no deformities, strength intact in all  4 Skin: moist, warm, no rashes Neurological: no tremor with outstretched hands, DTR normal in all 4  ASSESSMENT: 1. Multiple thyroid nodules  PLAN: 1. Multiple thyroid nodules -She denies neck compression symptoms except for dysphagia but only after Botox injections for cervical dystonia -Reviewed the report of her most recent thyroid ultrasound (01/2017):  1) the right thyroid nodule was not large, however, it was solid, with internal blood flow and not very well elevated from the surrounding tissue.  This was possibly a pseudonodule.  It was first biopsied in 2018 but the sample was inconclusive due to scant material.  We repeated the biopsy in 06/2017 and the result was benign.  2) the left thyroid nodule appears to be a conglomerate of 2 nodules, solid, one of the nodules containing apparent macrocalcifications.  Does have internal blood flow and more not very well the limited from the surrounding tissue.  We biopsied this nodule with benign results.  -We discussed at last visit that we do not need to re-biopsy the nodules unless they change ultrasound characteristics -Of note, she does not have a family history of thyroid cancer or personal history of radiation therapy to head or neck, favoring benignity -We will check another ultrasound  now -If the nodules appear stable, we can continue to follow her clinically, since she completed a 5-year follow-up -I did advise her to let me know if she develops neck compression symptoms -We reviewed her latest TSH from 03/2020 and this was normal.  Would not repeat this today. -I will see her back in 2 years.  Orders Placed This Encounter  Procedures  . US THYROID   Thyroid ultrasound (01/11/2021): Parenchymal Echotexture: Mildly heterogeneous Isthmus: 0.4 cm Right lobe: 4.3 x 1.7 x 1.5 cm Left lobe: 5.1 x 2.2 x 1.8 cm ______________________________________________________  Nodule 1: 1.4 x 1.0 x 0.8 cm hypoechoic solid nodule located in the mid right thyroid lobe is not significantly changed in size since 01/01/2017. FNA of this nodule was performed on 12/30/2016 and 07/10/2017. Please correlate with biopsy results.  _________________________________________________________  Nodule 2: 3.0 x 1.9 x 1.5 cm solid nodule located in the mid left thyroid lobe is not significantly changed in size since 01/01/2017. Prior FNA of this nodule was performed on 01/09/2017. Please correlate with biopsy results.  IMPRESSION: No significant interval change in size of bilateral thyroid nodules. Prior FNA of these nodules were performed on 01/09/2017 and 07/10/2017. Please correlate with biopsy results.  The above is in keeping with the ACR TI-RADS recommendations - J Am Coll Radiol 2017;14:587-595.  Electronically Signed   By: Acquanetta Belling M.D.   On: 01/11/2021 16:29  Nodules are stable.  Carlus Pavlov, MD PhD [facility] Endocrin[facility]

## 2020-12-10 ENCOUNTER — Other Ambulatory Visit: Payer: Self-pay

## 2020-12-10 ENCOUNTER — Ambulatory Visit: Payer: BC Managed Care – PPO | Admitting: Neurology

## 2020-12-10 DIAGNOSIS — G243 Spasmodic torticollis: Secondary | ICD-10-CM

## 2020-12-10 MED ORDER — ONABOTULINUMTOXINA 100 UNITS IJ SOLR
260.0000 [IU] | Freq: Once | INTRAMUSCULAR | Status: AC
  Administered 2020-12-10: 260 [IU] via INTRAMUSCULAR

## 2020-12-10 NOTE — Procedures (Signed)
Botulinum Clinic   Procedure Note Botox  Attending: Dr. Lurena Joiner Reighlynn Swiney  Preoperative Diagnosis(es): Cervical Dystonia  Result History  Did well after last injection  Consent obtained from: The patient Benefits discussed included, but were not limited to decreased muscle tightness, increased joint range of motion, and decreased pain.  Risk discussed included, but were not limited pain and discomfort, bleeding, bruising, excessive weakness, venous thrombosis, muscle atrophy and dysphagia.  A copy of the patient medication guide was given to the patient which explains the blackbox warning.  Patients identity and treatment sites confirmed Yes.  .  Details of Procedure: Skin was cleaned with alcohol.   Prior to injection, the needle plunger was aspirated to make sure the needle was not within a blood vessel.  There was no blood retrieved on aspiration.    Following is a summary of the muscles injected  And the amount of Botulinum toxin used:   Dilution 0.9% preservative free saline mixed with 100 u Botox type A to make 10 U per 0.1cc  Injections  Location Left  Right Units Number of sites        Sternocleidomastoid  60 60 1  Splenius Capitus, posterior approach 100  100 1  Splenius Capitus, lateral approach 40  40 1  Levator Scapulae      Trapezius  20/20/20 60 3        TOTAL UNITS:   260    Agent: Botulinum Type A ( Onobotulinum Toxin type A ).  3 vials of Botox were used, each containing 100 units and freshly diluted with 1 mL of sterile, non-preserved saline   Total injected (Units): 260  Total wasted (Units):0   Pt tolerated procedure well without complications.   Reinjection is anticipated in 3 months.

## 2020-12-17 ENCOUNTER — Other Ambulatory Visit: Payer: Self-pay

## 2020-12-17 ENCOUNTER — Ambulatory Visit (INDEPENDENT_AMBULATORY_CARE_PROVIDER_SITE_OTHER): Payer: BC Managed Care – PPO

## 2020-12-17 DIAGNOSIS — Z23 Encounter for immunization: Secondary | ICD-10-CM | POA: Diagnosis not present

## 2020-12-27 ENCOUNTER — Other Ambulatory Visit: Payer: BC Managed Care – PPO

## 2021-01-06 LAB — HM MAMMOGRAPHY

## 2021-01-11 ENCOUNTER — Ambulatory Visit
Admission: RE | Admit: 2021-01-11 | Discharge: 2021-01-11 | Disposition: A | Discharge status: Home / Self Care | Payer: BC Managed Care – PPO | Source: Ambulatory Visit | Attending: Internal Medicine | Admitting: Internal Medicine

## 2021-01-11 DIAGNOSIS — E042 Nontoxic multinodular goiter: Secondary | ICD-10-CM

## 2021-01-11 DIAGNOSIS — E041 Nontoxic single thyroid nodule: Secondary | ICD-10-CM | POA: Diagnosis not present

## 2021-01-11 NOTE — Addendum Note (Signed)
Addended by: Carlus Pavlov on: 01/11/2021 04:39 PM   Modules accepted: Orders

## 2021-01-18 DIAGNOSIS — R928 Other abnormal and inconclusive findings on diagnostic imaging of breast: Secondary | ICD-10-CM | POA: Diagnosis not present

## 2021-01-18 DIAGNOSIS — N6001 Solitary cyst of right breast: Secondary | ICD-10-CM | POA: Diagnosis not present

## 2021-01-25 ENCOUNTER — Encounter: Payer: Self-pay | Admitting: Internal Medicine

## 2021-03-11 ENCOUNTER — Ambulatory Visit (INDEPENDENT_AMBULATORY_CARE_PROVIDER_SITE_OTHER): Payer: BC Managed Care – PPO | Admitting: Neurology

## 2021-03-11 ENCOUNTER — Other Ambulatory Visit: Payer: Self-pay

## 2021-03-11 DIAGNOSIS — G243 Spasmodic torticollis: Secondary | ICD-10-CM

## 2021-03-11 MED ORDER — ONABOTULINUMTOXINA 100 UNITS IJ SOLR
260.0000 [IU] | Freq: Once | INTRAMUSCULAR | Status: AC
  Administered 2021-03-11: 260 [IU] via INTRAMUSCULAR

## 2021-03-11 NOTE — Procedures (Signed)
Botulinum Clinic   Procedure Note Botox  Attending: Dr. Lurena Joiner Jibreel Fedewa  Preoperative Diagnosis(es): Cervical Dystonia  Result History  Did well after last injection  Consent obtained from: The patient Benefits discussed included, but were not limited to decreased muscle tightness, increased joint range of motion, and decreased pain.  Risk discussed included, but were not limited pain and discomfort, bleeding, bruising, excessive weakness, venous thrombosis, muscle atrophy and dysphagia.  A copy of the patient medication guide was given to the patient which explains the blackbox warning.  Patients identity and treatment sites confirmed Yes.  .  Details of Procedure: Skin was cleaned with alcohol.   Prior to injection, the needle plunger was aspirated to make sure the needle was not within a blood vessel.  There was no blood retrieved on aspiration.    Following is a summary of the muscles injected  And the amount of Botulinum toxin used:   Dilution 0.9% preservative free saline mixed with 100 u Botox type A to make 10 U per 0.1cc  Injections  Location Left  Right Units Number of sites        Sternocleidomastoid  60 60 1  Splenius Capitus, posterior approach 100  100 1  Splenius Capitus, lateral approach 40  40 1  Levator Scapulae      Trapezius  20/20/20 60 3        TOTAL UNITS:   260    Agent: Botulinum Type A ( Onobotulinum Toxin type A ).  3 vials of Botox were used, each containing 100 units and freshly diluted with 1 mL of sterile, non-preserved saline   Total injected (Units): 260  Total wasted (Units):0   Pt tolerated procedure well without complications.   Reinjection is anticipated in 3 months.

## 2021-06-08 ENCOUNTER — Encounter: Payer: Self-pay | Admitting: *Deleted

## 2021-06-08 NOTE — Progress Notes (Signed)
Cover My Meds: Tracy May (Key: [id number])  Your information has been submitted to [facility]. [employer] [place] will review the request and notify you of the determination decision directly, typically within 72 hours of receiving all information.  You will also receive your request decision electronically. To check for an update later, open this request again from your dashboard.  If [employer] [place] has not responded within the specified timeframe or if you have any questions about your PA submission, contact Blue Cross  directly at [phone].

## 2021-06-10 NOTE — Progress Notes (Signed)
Received the following response from covermymeds:  [place] (Key: [id number])  This request has received a Favorable outcome from [facility].  Please keep in mind this is not a guarantee of payment. Eligibility and Benefit determinations will be made at the time of service.  Please note any additional information provided by [facility] at the bottom of the screen.

## 2021-06-16 DIAGNOSIS — N200 Calculus of kidney: Secondary | ICD-10-CM | POA: Diagnosis not present

## 2021-06-17 DIAGNOSIS — K5120 Ulcerative (chronic) proctitis without complications: Secondary | ICD-10-CM | POA: Diagnosis not present

## 2021-06-21 DIAGNOSIS — H40013 Open angle with borderline findings, low risk, bilateral: Secondary | ICD-10-CM | POA: Diagnosis not present

## 2021-06-27 NOTE — Progress Notes (Signed)
Tracy May (Key: [id number])  Your information has been submitted to [facility]. [employer] [place] will review the request and notify you of the determination decision directly, typically within 72 hours of receiving all information.  You will also receive your request decision electronically. To check for an update later, open this request again from your dashboard.  If [employer] [place] has not responded within the specified timeframe or if you have any questions about your PA submission, contact [employer] [place] directly at [phone].

## 2021-06-27 NOTE — Progress Notes (Signed)
F/u   Received fax from [facility] [place]  Botox 100 unit  Status approved  Quantity approved  4.0 visit  Effective date 06/08/21 to  06/07/2022

## 2021-06-30 ENCOUNTER — Ambulatory Visit: Payer: BC Managed Care – PPO | Admitting: Neurology

## 2021-06-30 ENCOUNTER — Other Ambulatory Visit: Payer: Self-pay

## 2021-06-30 DIAGNOSIS — G243 Spasmodic torticollis: Secondary | ICD-10-CM | POA: Diagnosis not present

## 2021-06-30 MED ORDER — ONABOTULINUMTOXINA 100 UNITS IJ SOLR
300.0000 [IU] | Freq: Once | INTRAMUSCULAR | Status: AC
  Administered 2021-06-30: 260 [IU] via INTRAMUSCULAR

## 2021-06-30 NOTE — Progress Notes (Signed)
Botulinum Clinic   Procedure Note Botox  Attending: Dr. Lurena Joiner Nakeeta Sebastiani  Preoperative Diagnosis(es): Cervical Dystonia  Result History  Doing well/stable  Consent obtained from: The patient Benefits discussed included, but were not limited to decreased muscle tightness, increased joint range of motion, and decreased pain.  Risk discussed included, but were not limited pain and discomfort, bleeding, bruising, excessive weakness, venous thrombosis, muscle atrophy and dysphagia.  A copy of the patient medication guide was given to the patient which explains the blackbox warning.  Patients identity and treatment sites confirmed Yes.  .  Details of Procedure: Skin was cleaned with alcohol.   Prior to injection, the needle plunger was aspirated to make sure the needle was not within a blood vessel.  There was no blood retrieved on aspiration.    Following is a summary of the muscles injected  And the amount of Botulinum toxin used:   Dilution 0.9% preservative free saline mixed with 100 u Botox type A to make 10 U per 0.1cc  Injections  Location Left  Right Units Number of sites        Sternocleidomastoid  60 60 1  Splenius Capitus, posterior approach 100  100 1  Splenius Capitus, lateral approach 40  40 1  Levator Scapulae      Trapezius  20/20/20 60 3        TOTAL UNITS:   260    Agent: Botulinum Type A ( Onobotulinum Toxin type A ).  3 vials of Botox were used, each containing 100 units and freshly diluted with 1 mL of sterile, non-preserved saline   Total injected (Units): 260  Total wasted (Units):40   Pt tolerated procedure well without complications.   Reinjection is anticipated in 3 months.

## 2021-07-11 DIAGNOSIS — L700 Acne vulgaris: Secondary | ICD-10-CM | POA: Diagnosis not present

## 2021-07-11 DIAGNOSIS — L680 Hirsutism: Secondary | ICD-10-CM | POA: Diagnosis not present

## 2021-08-26 ENCOUNTER — Telehealth: Payer: Self-pay

## 2021-08-26 NOTE — Telephone Encounter (Signed)
New message   Benefit Verification [id number] Submitted! For BV Basic submissions, please allow 24 hours for results.  For BV Full submissions, please allow 24-48 hours for results.

## 2021-08-30 NOTE — Telephone Encounter (Signed)
F/u   Fax office notes to [facility] at [phone]

## 2021-09-01 DIAGNOSIS — F5104 Psychophysiologic insomnia: Secondary | ICD-10-CM | POA: Diagnosis not present

## 2021-09-01 DIAGNOSIS — Z79890 Hormone replacement therapy (postmenopausal): Secondary | ICD-10-CM | POA: Diagnosis not present

## 2021-09-01 DIAGNOSIS — Z6827 Body mass index (BMI) 27.0-27.9, adult: Secondary | ICD-10-CM | POA: Diagnosis not present

## 2021-09-01 DIAGNOSIS — Z01419 Encounter for gynecological examination (general) (routine) without abnormal findings: Secondary | ICD-10-CM | POA: Diagnosis not present

## 2021-09-09 ENCOUNTER — Telehealth: Payer: Self-pay

## 2021-09-09 ENCOUNTER — Other Ambulatory Visit (HOSPITAL_COMMUNITY): Payer: Self-pay

## 2021-09-09 NOTE — Telephone Encounter (Signed)
F/u   Website Botox[facility]   BOTOX (onabotulinumtoxinA)  ACQUISITION - MAJOR MEDICAL BENEFITS  List of Specialty Pharmacies may not include all available options. If preferred Specialty Pharmacy is not listed, check with payer.   [?] [facility] [place] [facility] Available [facility] [phone]

## 2021-09-09 NOTE — Telephone Encounter (Signed)
New message   Tracy May Key: [id number] help? Call us at [phone] Status Sent to [employer] Drug Botox 100UNIT solution Form [employer] [place] Commercial Medical Benefit Electronic Request Form (CB)  Your information has been submitted to [employer] [place]. [employer] [place] will review the request and notify you of the determination decision directly, typically within 72 hours of receiving all information.  You will also receive your request decision electronically. To check for an update later, open this request again from your dashboard.  If [employer] [place] has not responded within the specified timeframe or if you have any questions about your PA submission, contact Blue Cross [place] directly at [phone].

## 2021-09-12 NOTE — Telephone Encounter (Signed)
F/u  EBECCA Bekele Key: [id number] help? Call us at [phone] Outcome Approvedon December 16 Effective from 09/09/2021 through 08/10/2022. Drug Botox 100UNIT solution Form Barrister's clerk Form (CB)

## 2021-09-14 ENCOUNTER — Other Ambulatory Visit: Payer: Self-pay

## 2021-09-14 DIAGNOSIS — G243 Spasmodic torticollis: Secondary | ICD-10-CM

## 2021-09-14 MED ORDER — BOTOX 100 UNITS IJ SOLR: 100.0000 [IU] | Freq: Once | INTRAMUSCULAR | 0 refills | Status: AC

## 2021-09-14 NOTE — Progress Notes (Signed)
Prescription sent to [facility] or 300 Units of Botox for Cervical Dystonia

## 2021-09-16 ENCOUNTER — Telehealth: Payer: Self-pay | Admitting: Neurology

## 2021-09-16 NOTE — Telephone Encounter (Signed)
Received a mess from  [facility]- ins doesn't pay for it, its a buy and bill. Please send her a mychart. She said you dont have to call her, just do a mychart. She wants to make sure she still is able to get botox in [place]

## 2021-09-30 ENCOUNTER — Ambulatory Visit: Payer: BC Managed Care – PPO | Admitting: Neurology

## 2021-09-30 ENCOUNTER — Other Ambulatory Visit: Payer: Self-pay

## 2021-09-30 DIAGNOSIS — G243 Spasmodic torticollis: Secondary | ICD-10-CM | POA: Diagnosis not present

## 2021-09-30 MED ORDER — ONABOTULINUMTOXINA 100 UNITS IJ SOLR
300.0000 [IU] | Freq: Once | INTRAMUSCULAR | Status: AC
  Administered 2021-09-30: 300 [IU] via INTRAMUSCULAR

## 2021-09-30 NOTE — Procedures (Signed)
Botulinum Clinic   Procedure Note Botox  Attending: Dr. Lurena Joiner Tobiah Celestine  Preoperative Diagnosis(es): Cervical Dystonia  Result History  Doing well/stable but has noted wearing off  Consent obtained from: The patient Benefits discussed included, but were not limited to decreased muscle tightness, increased joint range of motion, and decreased pain.  Risk discussed included, but were not limited pain and discomfort, bleeding, bruising, excessive weakness, venous thrombosis, muscle atrophy and dysphagia.  A copy of the patient medication guide was given to the patient which explains the blackbox warning.  Patients identity and treatment sites confirmed Yes.  .  Details of Procedure: Skin was cleaned with alcohol.   Prior to injection, the needle plunger was aspirated to make sure the needle was not within a blood vessel.  There was no blood retrieved on aspiration.    Following is a summary of the muscles injected  And the amount of Botulinum toxin used:   Dilution 0.9% preservative free saline mixed with 100 u Botox type A to make 10 U per 0.1cc  Injections  Location Left  Right Units Number of sites        Sternocleidomastoid  60 60 1  Splenius Capitus, posterior approach 100  100 1  Splenius Capitus, lateral approach 40  40 1  Levator Scapulae      Trapezius  20/20/20 60 3        TOTAL UNITS:   260    Agent: Botulinum Type A ( Onobotulinum Toxin type A ).  3 vials of Botox were used, each containing 100 units and freshly diluted with 1 mL of sterile, non-preserved saline   Total injected (Units): 260  Total wasted (Units):40   Pt tolerated procedure well without complications.   Reinjection is anticipated in 3 months.

## 2021-09-30 NOTE — Procedures (Signed)
Botulinum Clinic   Procedure Note Botox  Attending: Dr. Wells Guiles Saje Gallop  Preoperative Diagnosis(es): Cervical Dystonia  Result History  Stable with botox  Consent obtained from: The patient Benefits discussed included, but were not limited to decreased muscle tightness, increased joint range of motion, and decreased pain.  Risk discussed included, but were not limited pain and discomfort, bleeding, bruising, excessive weakness, venous thrombosis, muscle atrophy and dysphagia.  A copy of the patient medication guide was given to the patient which explains the blackbox warning.  Patients identity and treatment sites confirmed Yes.  .  Details of Procedure: Skin was cleaned with alcohol.   Prior to injection, the needle plunger was aspirated to make sure the needle was not within a blood vessel.  There was no blood retrieved on aspiration.    Following is a summary of the muscles injected  And the amount of Botulinum toxin used:   Dilution 0.9% preservative free saline mixed with 100 u Botox type A to make 10 U per 0.1cc  Injections  Location Left  Right Units Number of sites        Sternocleidomastoid  60 60 1  Splenius Capitus, posterior approach 100  100 1  Splenius Capitus, lateral approach 40  40 1  Levator Scapulae      Trapezius  20/20/20 60 3        TOTAL UNITS:   260    Agent: Botulinum Type A ( Onobotulinum Toxin type A ).  3 vials of Botox were used, each containing 100 units and freshly diluted with 1 mL of sterile, non-preserved saline   Total injected (Units): 260  Total wasted (Units):40   Pt tolerated procedure well without complications.   Reinjection is anticipated in 3 months.

## 2021-10-06 ENCOUNTER — Encounter: Payer: Self-pay | Admitting: Internal Medicine

## 2021-10-06 ENCOUNTER — Other Ambulatory Visit: Payer: Self-pay

## 2021-10-06 ENCOUNTER — Ambulatory Visit (INDEPENDENT_AMBULATORY_CARE_PROVIDER_SITE_OTHER): Payer: BC Managed Care – PPO | Admitting: Internal Medicine

## 2021-10-06 VITALS — BP 112/70 | HR 61 | Temp 98.6°F | Ht 64.0 in | Wt 154.2 lb

## 2021-10-06 DIAGNOSIS — F5101 Primary insomnia: Secondary | ICD-10-CM

## 2021-10-06 DIAGNOSIS — Z0000 Encounter for general adult medical examination without abnormal findings: Secondary | ICD-10-CM | POA: Diagnosis not present

## 2021-10-06 LAB — COMPREHENSIVE METABOLIC PANEL
ALT: 15 U/L (ref 0–35)
AST: 16 U/L (ref 0–37)
Albumin: 4 g/dL (ref 3.5–5.2)
Alkaline Phosphatase: 47 U/L (ref 39–117)
BUN: 12 mg/dL (ref 6–23)
CO2: 29 mEq/L (ref 19–32)
Calcium: 9.3 mg/dL (ref 8.4–10.5)
Chloride: 104 mEq/L (ref 96–112)
Creatinine, Ser: 0.83 mg/dL (ref 0.40–1.20)
GFR: 74.75 mL/min (ref >60.00)
Glucose, Bld: 94 mg/dL (ref 70–99)
Potassium: 4.1 mEq/L (ref 3.5–5.1)
Sodium: 138 mEq/L (ref 135–145)
Total Bilirubin: 0.6 mg/dL (ref 0.2–1.2)
Total Protein: 6.5 g/dL (ref 6.0–8.3)

## 2021-10-06 LAB — LIPID PANEL
Cholesterol: 227 mg/dL — ABNORMAL HIGH (ref 0–200)
HDL: 76.9 mg/dL (ref >39.00)
LDL Cholesterol: 142 mg/dL — ABNORMAL HIGH (ref 0–99)
NonHDL: 150.01
Total CHOL/HDL Ratio: 3
Triglycerides: 39 mg/dL (ref 0.0–149.0)
VLDL: 7.8 mg/dL (ref 0.0–40.0)

## 2021-10-06 LAB — URINALYSIS
Bilirubin Urine: NEGATIVE
Hgb urine dipstick: NEGATIVE
Ketones, ur: NEGATIVE
Leukocytes,Ua: NEGATIVE
Nitrite: NEGATIVE
Specific Gravity, Urine: 1.01 (ref 1.000–1.030)
Total Protein, Urine: NEGATIVE
Urine Glucose: NEGATIVE
Urobilinogen, UA: 1 (ref 0.0–1.0)
pH: 8 (ref 5.0–8.0)

## 2021-10-06 LAB — CBC WITH DIFFERENTIAL/PLATELET
Basophils Absolute: 0 10*3/uL (ref 0.0–0.1)
Basophils Relative: 0.4 % (ref 0.0–3.0)
Eosinophils Absolute: 0.1 10*3/uL (ref 0.0–0.7)
Eosinophils Relative: 3 % (ref 0.0–5.0)
HCT: 40.6 % (ref 36.0–46.0)
Hemoglobin: 13.4 g/dL (ref 12.0–15.0)
Lymphocytes Relative: 28.5 % (ref 12.0–46.0)
Lymphs Abs: 1.4 10*3/uL (ref 0.7–4.0)
MCHC: 33.1 g/dL (ref 30.0–36.0)
MCV: 93.8 fl (ref 78.0–100.0)
Monocytes Absolute: 0.4 10*3/uL (ref 0.1–1.0)
Monocytes Relative: 7.6 % (ref 3.0–12.0)
Neutro Abs: 2.9 10*3/uL (ref 1.4–7.7)
Neutrophils Relative %: 60.5 % (ref 43.0–77.0)
Platelets: 321 10*3/uL (ref 150.0–400.0)
RBC: 4.33 Mil/uL (ref 3.87–5.11)
RDW: 12.8 % (ref 11.5–15.5)
WBC: 4.8 10*3/uL (ref 4.0–10.5)

## 2021-10-06 LAB — TSH: TSH: 0.96 u[IU]/mL (ref 0.35–5.50)

## 2021-10-06 NOTE — Progress Notes (Signed)
Subjective:  Patient ID: Tracy May, female    DOB: October 05, 1955  Age: 66 y.o. MRN: [id number]  CC: Annual Exam   HPI Tracy May presents for a well exam C/o stress  Outpatient Medications Prior to Visit  Medication Sig Dispense Refill   acetaminophen (TYLENOL) 500 MG tablet Take 1,000 mg by mouth every 6 (six) hours as needed for moderate pain or headache.      Azelaic Acid 15 % cream Apply 1 application topically 2 (two) times daily as needed (for acne). After skin is thoroughly washed and patted dry, gently but thoroughly massage a thin film of azelaic acid cream into the affected area twice daily, in the morning and evening.     b complex vitamins tablet Take 1 tablet by mouth daily. 100 tablet 3   Biotin 5 MG CAPS Take 10,000 mg by mouth every Monday, Wednesday, and Friday.      cetirizine (ZYRTEC) 10 MG tablet Take 10 mg by mouth daily as needed for allergies.     Cholecalciferol 2000 UNITS TABS Take 2,000 Units by mouth daily.      estradiol (VIVELLE-DOT) 0.05 MG/24HR patch Place 1 patch onto the skin 2 (two) times a week.     Magnesium 250 MG TABS Take 250 mg by mouth daily.      meclizine (ANTIVERT) 12.5 MG tablet TAKE 1 TABLET(12.5 MG) BY MOUTH DAILY AS NEEDED FOR DIZZINESS 30 tablet 1   tretinoin (RETIN-A) 0.025 % cream Apply 1 application topically at bedtime as needed (for acne).     zolpidem (AMBIEN) 10 MG tablet Take 1 tablet (10 mg total) by mouth at bedtime as needed. for sleep (Patient taking differently: Take 10 mg by mouth at bedtime.) 30 tablet 3   No facility-administered medications prior to visit.    ROS: Review of Systems  Constitutional:  Negative for activity change, appetite change, chills, fatigue and unexpected weight change.  HENT:  Negative for congestion, mouth sores and sinus pressure.   Eyes:  Negative for visual disturbance.  Respiratory:  Negative for cough and chest tightness.   Gastrointestinal:  Negative for abdominal pain and nausea.   Genitourinary:  Negative for difficulty urinating, frequency and vaginal pain.  Musculoskeletal:  Negative for back pain and gait problem.  Skin:  Negative for pallor and rash.  Neurological:  Negative for dizziness, tremors, weakness, numbness and headaches.  Psychiatric/Behavioral:  Negative for confusion and sleep disturbance.    Objective:  BP 112/70 (BP Location: Left Arm)    Pulse 61    Temp 98.6 F (37 C) (Oral)    Ht 5\' 4"  (1.626 m)    Wt 154 lb 3.2 oz (69.9 kg)    SpO2 97%    BMI 26.47 kg/m   BP Readings from Last 3 Encounters:  10/06/21 112/70  12/03/20 128/90  04/06/20 126/82    Wt Readings from Last 3 Encounters:  10/06/21 154 lb 3.2 oz (69.9 kg)  12/03/20 160 lb (72.6 kg)  04/06/20 148 lb (67.1 kg)    Physical Exam Constitutional:      General: She is not in acute distress.    Appearance: She is well-developed.  HENT:     Head: Normocephalic.     Right Ear: External ear normal.     Left Ear: External ear normal.     Nose: Nose normal.  Eyes:     General:        Right eye: No discharge.  Left eye: No discharge.     Conjunctiva/sclera: Conjunctivae normal.     Pupils: Pupils are equal, round, and reactive to light.  Neck:     Thyroid: No thyromegaly.     Vascular: No JVD.     Trachea: No tracheal deviation.  Cardiovascular:     Rate and Rhythm: Normal rate and regular rhythm.     Heart sounds: Normal heart sounds.  Pulmonary:     Effort: No respiratory distress.     Breath sounds: No stridor. No wheezing.  Abdominal:     General: Bowel sounds are normal. There is no distension.     Palpations: Abdomen is soft. There is no mass.     Tenderness: There is no abdominal tenderness. There is no guarding or rebound.  Musculoskeletal:        General: No tenderness.     Cervical back: Normal range of motion and neck supple. No rigidity.  Lymphadenopathy:     Cervical: No cervical adenopathy.  Skin:    Findings: No erythema or rash.  Neurological:      Cranial Nerves: No cranial nerve deficit.     Motor: No abnormal muscle tone.     Coordination: Coordination normal.     Deep Tendon Reflexes: Reflexes normal.  Psychiatric:        Behavior: Behavior normal.        Thought Content: Thought content normal.        Judgment: Judgment normal.    Lab Results  Component Value Date   WBC 5.6 04/06/2020   HGB 14.1 04/06/2020   HCT 42.3 04/06/2020   PLT 325 04/06/2020   GLUCOSE 87 04/06/2020   CHOL 232 (H) 04/06/2020   TRIG 45 04/06/2020   HDL 87 04/06/2020   LDLDIRECT 102.3 10/16/2012   LDLCALC 131 (H) 04/06/2020   ALT 15 04/06/2020   AST 16 04/06/2020   NA 137 04/06/2020   K 4.5 04/06/2020   CL 101 04/06/2020   CREATININE 0.86 04/06/2020   BUN 12 04/06/2020   CO2 29 04/06/2020   TSH 1.00 04/06/2020    US THYROID  Result Date: 01/11/2021 CLINICAL DATA:  Thyroid nodule follow-up EXAM: THYROID ULTRASOUND TECHNIQUE: Ultrasound examination of the thyroid gland and adjacent soft tissues was performed. COMPARISON:  01/01/2017 01/09/2017 07/10/2017 FINDINGS: Parenchymal Echotexture: Mildly heterogeneous Isthmus: 0.4 cm Right lobe: 4.3 x 1.7 x 1.5 cm Left lobe: 5.1 x 2.2 x 1.8 cm _________________________________________________________ Estimated total number of nodules >/= 1 cm: 2 Number of spongiform nodules >/=  2 cm not described below (TR1): 0 Number of mixed cystic and solid nodules >/= 1.5 cm not described below (TR2): 0 _________________________________________________________ Nodule 1: 1.4 x 1.0 x 0.8 cm hypoechoic solid nodule located in the mid right thyroid lobe is not significantly changed in size since 01/01/2017. FNA of this nodule was performed on 12/30/2016 and 07/10/2017. Please correlate with biopsy results. _________________________________________________________ Nodule 2: 3.0 x 1.9 x 1.5 cm solid nodule located in the mid left thyroid lobe is not significantly changed in size since 01/01/2017. Prior FNA of this nodule  was performed on 01/09/2017. Please correlate with biopsy results. IMPRESSION: No significant interval change in size of bilateral thyroid nodules. Prior FNA of these nodules were performed on 01/09/2017 and 07/10/2017. Please correlate with biopsy results. The above is in keeping with the ACR TI-RADS recommendations - J Am Coll Radiol 2017;14:587-595. Electronically Signed   By: Acquanetta Belling M.D.   On: 01/11/2021 16:29  Assessment & Plan:   Problem List Items Addressed This Visit     Insomnia    Zolpidem prn  Potential benefits of a long term benzodiazepines  use as well as potential risks  and complications were explained to the patient and were aknowledged.      Well adult exam    We discussed age appropriate health related issues, including available/recomended screening tests and vaccinations. We discussed a need for adhering to healthy diet and exercise. Labs were ordered to be later reviewed . All questions were answered. Pt declined tDAP today. Colon 2019 CT coronary calcium score is 0 - 9/20      Relevant Orders   TSH   Urinalysis   CBC with Differential/Platelet   Lipid panel   Comprehensive metabolic panel      No orders of the defined types were placed in this encounter.     Follow-up: Return in about 1 year (around 10/06/2022) for Wellness Exam.  Sonda PrimesAlex Rashiya Lofland, MD

## 2021-10-06 NOTE — Patient Instructions (Signed)
For a mild COVID-19 case - take zinc 50 mg a day for 1 week, vitamin C 1000 mg daily for 1 week, vitamin D2 50,000 units weekly for 2 months (unless  taking vitamin D daily already), an antioxidant Quercetin 500 mg twice a day for 1 week (if you can get it quick enough). Take Allegra or Benadryl.  Maintain good oral hydration and take Tylenol for high fever.  Call if problems. Isolate for 5 days, then wear a mask for 5 days per CDC.  

## 2021-10-06 NOTE — Assessment & Plan Note (Addendum)
We discussed age appropriate health related issues, including available/recomended screening tests and vaccinations. We discussed a need for adhering to healthy diet and exercise. Labs were ordered to be later reviewed . All questions were answered. Pt declined tDAP today. Colon 2019 CT coronary calcium score is 0 - 9/20

## 2021-10-06 NOTE — Assessment & Plan Note (Signed)
Zolpidem prn  Potential benefits of a long term benzodiazepines  use as well as potential risks  and complications were explained to the patient and were aknowledged. 

## 2021-10-11 ENCOUNTER — Encounter: Payer: Self-pay | Admitting: Internal Medicine

## 2021-10-19 DIAGNOSIS — K5120 Ulcerative (chronic) proctitis without complications: Secondary | ICD-10-CM | POA: Diagnosis not present

## 2021-10-19 DIAGNOSIS — K5904 Chronic idiopathic constipation: Secondary | ICD-10-CM | POA: Diagnosis not present

## 2021-12-02 ENCOUNTER — Encounter: Payer: Self-pay | Admitting: Neurology

## 2021-12-10 ENCOUNTER — Other Ambulatory Visit: Payer: Self-pay | Admitting: Neurology

## 2021-12-20 ENCOUNTER — Other Ambulatory Visit: Payer: Self-pay

## 2021-12-20 DIAGNOSIS — G243 Spasmodic torticollis: Secondary | ICD-10-CM

## 2021-12-20 MED ORDER — BOTOX 100 UNITS IJ SOLR: 100.0000 [IU] | Freq: Once | INTRAMUSCULAR | 0 refills | Status: AC

## 2022-01-12 ENCOUNTER — Ambulatory Visit: Payer: Managed Care, Other (non HMO) | Admitting: Neurology

## 2022-01-12 DIAGNOSIS — G243 Spasmodic torticollis: Secondary | ICD-10-CM

## 2022-01-12 NOTE — Procedures (Signed)
[facility]  ? ?Procedure Note Botox ? ?Attending: Dr. Lurena Joiner Lasheika Ortloff ? ?Preoperative Diagnosis(es): Cervical Dystonia ? ?Result History  ?Doing well.  Some head tremor with stress ? ?Consent obtained from: The patient ?Benefits discussed included, but were not limited to decreased muscle tightness, increased joint range of motion, and decreased pain.  Risk discussed included, but were not limited pain and discomfort, bleeding, bruising, excessive weakness, venous thrombosis, muscle atrophy and dysphagia.  A copy of the patient medication guide was given to the patient which explains the blackbox warning. ? ?Patients identity and treatment sites confirmed Yes.  . ? ?Details of Procedure: ?Skin was cleaned with alcohol.   Prior to injection, the needle plunger was aspirated to make sure the needle was not within a blood vessel.  There was no blood retrieved on aspiration.   ? ?Following is a summary of the muscles injected  And the amount of Botulinum toxin used: ? ? ?Dilution ?0.9% preservative free saline mixed with 100 u Botox type A to make 10 U per 0.1cc ? ?Injections  ?Location Left  Right Units Number of sites  ?      ?Sternocleidomastoid  60 60 1  ?Splenius Capitus, posterior approach 100  100 1  ?Splenius Capitus, lateral approach 40  40 1  ?Levator Scapulae      ?Trapezius  20/20/20 60 3  ?      ?TOTAL UNITS:   260   ? ?Agent: Botulinum Type A ( Onobotulinum Toxin type A ).  3 vials of Botox were used, each containing 100 units and freshly diluted with 1 mL of sterile, non-preserved saline ? ? Total injected (Units): 260 ? Total wasted (Units):40 ? ? ?Pt tolerated procedure well without complications.   ?Reinjection is anticipated in 3 months. ?

## 2022-04-14 ENCOUNTER — Ambulatory Visit: Payer: Managed Care, Other (non HMO) | Admitting: Neurology

## 2022-04-14 DIAGNOSIS — G243 Spasmodic torticollis: Secondary | ICD-10-CM

## 2022-04-14 MED ORDER — ONABOTULINUMTOXINA 100 UNITS IJ SOLR
300.0000 [IU] | Freq: Once | INTRAMUSCULAR | Status: AC
  Administered 2022-04-14: 260 [IU] via INTRAMUSCULAR

## 2022-04-14 NOTE — Procedures (Signed)
[facility]   Procedure Note Botox  Attending: Dr. Carmaleta Gloriann Riede  Preoperative Diagnosis(es): Cervical Dystonia  Result History  Doing well.    Consent obtained from: The patient Benefits discussed included, but were not limited to decreased muscle tightness, increased joint range of motion, and decreased pain.  Risk discussed included, but were not limited pain and discomfort, bleeding, bruising, excessive weakness, venous thrombosis, muscle atrophy and dysphagia.  A copy of the patient medication guide was given to the patient which explains the blackbox warning.  Patients identity and treatment sites confirmed Yes.  .  Details of Procedure: Skin was cleaned with alcohol.   Prior to injection, the needle plunger was aspirated to make sure the needle was not within a blood vessel.  There was no blood retrieved on aspiration.    Following is a summary of the muscles injected  And the amount of Botulinum toxin used:   Dilution 0.9% preservative free saline mixed with 100 u Botox type A to make 10 U per 0.1cc  Injections  Location Left  Right Units Number of sites        Sternocleidomastoid  60 60 1  Splenius Capitus, posterior approach 100  100 1  Splenius Capitus, lateral approach 40  40 1  Levator Scapulae      Trapezius  20/20/20 60 3        TOTAL UNITS:   260    Agent: Botulinum Type A ( Onobotulinum Toxin type A ).  3 vials of Botox were used, each containing 100 units and freshly diluted with 1 mL of sterile, non-preserved saline   Total injected (Units): 260  Total wasted (Units):40   Pt tolerated procedure well without complications.   Reinjection is anticipated in 3 months. 

## 2022-06-20 ENCOUNTER — Telehealth (INDEPENDENT_AMBULATORY_CARE_PROVIDER_SITE_OTHER): Payer: Managed Care, Other (non HMO) | Admitting: Emergency Medicine

## 2022-06-20 ENCOUNTER — Encounter: Payer: Self-pay | Admitting: Emergency Medicine

## 2022-06-20 DIAGNOSIS — R6889 Other general symptoms and signs: Secondary | ICD-10-CM | POA: Diagnosis not present

## 2022-06-20 DIAGNOSIS — U071 COVID-19: Secondary | ICD-10-CM | POA: Diagnosis not present

## 2022-06-20 NOTE — Progress Notes (Signed)
Telemedicine Encounter- SOAP NOTE Established Patient MyChart video encounter Patient: Home  Provider: Office   Patient present only  This video encounter was conducted with the patient's (or proxy's) verbal consent via audio telecommunications: yes/no: Yes Patient was instructed to have this encounter in a suitably private space; and to only have persons present to whom they give permission to participate. In addition, patient identity was confirmed by use of name plus two identifiers (DOB and address).  I discussed the limitations, risks, security and privacy concerns of performing an evaluation and management service by telephone and the availability of in person appointments. I also discussed with the patient that there may be a patient responsible charge related to this service. The patient expressed understanding and agreed to proceed.  I spent a total of TIME; 0 MIN TO 60 MIN: 20 minutes talking with the patient or their proxy.  Chief complaint: Flulike symptoms  Subjective   Tracy May is a 66 y.o. female established patient. Telephone visit today complaining of COVID infection.  Developed flulike symptoms 3 days ago. Just recently returned from trip to [place].  Symptoms slowly getting better.  Mostly cough and runny nose at this time. Denies difficulty breathing.  Denies chest pain or palpitations.  Denies fever or chills.  Able to eat and drink.  Denies nausea or vomiting. No other associated symptoms. No other complaints or medical concerns today.  HPI   Patient Active Problem List   Diagnosis Date Noted   Rectal bleeding 03/24/2019   Eczema 03/24/2019   Trigeminal neuralgia 08/27/2017   Multiple thyroid nodules 01/11/2017   Asthma 11/07/2016   Benign head tremor 11/07/2016   Benign paroxysmal positional vertigo 08/26/2014   Impacted cerumen of left ear 11/03/2013   Elevated BP 11/03/2013   Sinusitis, acute 09/11/2013   Well adult exam 09/20/2011    COSTOCHONDRITIS 11/01/2010   CHEST PAIN 11/01/2010   VERTIGO 09/09/2010   WEAKNESS 09/09/2010   PARESTHESIA 09/30/2008   BELLS PALSY 09/09/2008   LOW BACK PAIN 02/21/2008   FEVER UNSPECIFIED 02/21/2008   Abdominal pain 02/21/2008   ALLERGIC RHINITIS 11/22/2007   NECK PAIN 11/22/2007   Insomnia 07/26/2007   GERD 07/26/2007   ACNE 07/26/2007    Past Medical History:  Diagnosis Date   Acne    Allergic rhinitis    Cervical dystonia 2017   GERD (gastroesophageal reflux disease)    PONV (postoperative nausea and vomiting)    Thyroid nodule 12/2016   Vertigo     Current Outpatient Medications  Medication Sig Dispense Refill   acetaminophen (TYLENOL) 500 MG tablet Take 1,000 mg by mouth every 6 (six) hours as needed for moderate pain or headache.      Azelaic Acid 15 % cream Apply 1 application topically 2 (two) times daily as needed (for acne). After skin is thoroughly washed and patted dry, gently but thoroughly massage a thin film of azelaic acid cream into the affected area twice daily, in the morning and evening.     b complex vitamins tablet Take 1 tablet by mouth daily. 100 tablet 3   Biotin 5 MG CAPS Take 10,000 mg by mouth every Monday, Wednesday, and Friday.      cetirizine (ZYRTEC) 10 MG tablet Take 10 mg by mouth daily as needed for allergies.     Cholecalciferol 2000 UNITS TABS Take 2,000 Units by mouth daily.      estradiol (VIVELLE-DOT) 0.05 MG/24HR patch Place 1 patch onto the skin 2 (two)  times a week.     Magnesium 250 MG TABS Take 250 mg by mouth daily.      meclizine (ANTIVERT) 12.5 MG tablet TAKE 1 TABLET(12.5 MG) BY MOUTH DAILY AS NEEDED FOR DIZZINESS 30 tablet 1   tretinoin (RETIN-A) 0.025 % cream Apply 1 application topically at bedtime as needed (for acne).     zolpidem (AMBIEN) 10 MG tablet Take 1 tablet (10 mg total) by mouth at bedtime as needed. for sleep (Patient taking differently: Take 10 mg by mouth at bedtime.) 30 tablet 3   No current  facility-administered medications for this visit.    Allergies  Allergen Reactions   Azithromycin Nausea Only and Other (See Comments)    Stomach pains   Sulfonamide Derivatives Itching    Social History   Socioeconomic History   Marital status: Married    Spouse name: Not on file   Number of children: Not on file   Years of education: Not on file   Highest education level: Not on file  Occupational History   Occupation: CMA    Comment: Tax adviser  Tobacco Use   Smoking status: Former    Types: Cigarettes    Quit date: 11/13/1996    Years since quitting: 25.6   Smokeless tobacco: Never  Vaping Use   Vaping Use: Never used  Substance and Sexual Activity   Alcohol use: Yes    Comment: glass of wine a month   Drug use: No   Sexual activity: Not on file  Other Topics Concern   Not on file  Social History Narrative   Regular Exercise- yes   Social Determinants of Health   Financial Resource Strain: Not on file  Food Insecurity: Not on file  Transportation Needs: Not on file  Physical Activity: Not on file  Stress: Not on file  Social Connections: Not on file  Intimate Partner Violence: Not on file    Review of Systems  Constitutional: Negative.  Negative for chills and fever.  HENT:  Positive for congestion.   Respiratory:  Positive for cough.   Cardiovascular: Negative.  Negative for chest pain and palpitations.  Gastrointestinal:  Negative for abdominal pain, nausea and vomiting.  Genitourinary: Negative.  Negative for dysuria.  Skin: Negative.  Negative for rash.  Neurological: Negative.  Negative for dizziness and headaches.  All other systems reviewed and are negative.   Objective  Alert and oriented x3 in no apparent respiratory distress Vitals as reported by the patient: There were no vitals filed for this visit.  Problem List Items Addressed This Visit   None Visit Diagnoses     COVID-19 virus infection    -  Primary   Flu-like symptoms           Clinically stable.  No red flag signs or symptoms. COVID infection running its course.  Symptoms slowly getting better.  No complications. Prefers not to start antivirals at this point.  Not recommended given improving condition. ED precautions given. Advised to stay home, stay well-hydrated, rest and isolate for 5 to 7 days. COVID advice given. Advised to contact the office if no better or worse during the next couple days.    I discussed the assessment and treatment plan with the patient. The patient was provided an opportunity to ask questions and all were answered. The patient agreed with the plan and demonstrated an understanding of the instructions.   The patient was advised to call back or seek an in-person evaluation if the  symptoms worsen or if the condition fails to improve as anticipated.  I provided 20 minutes of non-face-to-face time during this encounter.  Horald Pollen, MD  [facility] at [facility]

## 2022-07-21 ENCOUNTER — Ambulatory Visit: Payer: Managed Care, Other (non HMO) | Admitting: Neurology

## 2022-07-21 DIAGNOSIS — G243 Spasmodic torticollis: Secondary | ICD-10-CM

## 2022-07-21 MED ORDER — ONABOTULINUMTOXINA 100 UNITS IJ SOLR
300.0000 [IU] | Freq: Once | INTRAMUSCULAR | Status: AC
  Administered 2022-07-21: 260 [IU] via INTRAMUSCULAR

## 2022-07-21 NOTE — Procedures (Signed)
[facility]   Procedure Note Botox  Attending: Dr. Wells Guiles Harshil Cavallaro  Preoperative Diagnosis(es): Cervical Dystonia  Result History  Doing well.    Consent obtained from: The patient Benefits discussed included, but were not limited to decreased muscle tightness, increased joint range of motion, and decreased pain.  Risk discussed included, but were not limited pain and discomfort, bleeding, bruising, excessive weakness, venous thrombosis, muscle atrophy and dysphagia.  A copy of the patient medication guide was given to the patient which explains the blackbox warning.  Patients identity and treatment sites confirmed Yes.  .  Details of Procedure: Skin was cleaned with alcohol.   Prior to injection, the needle plunger was aspirated to make sure the needle was not within a blood vessel.  There was no blood retrieved on aspiration.    Following is a summary of the muscles injected  And the amount of Botulinum toxin used:   Dilution 0.9% preservative free saline mixed with 100 u Botox type A to make 10 U per 0.1cc  Injections  Location Left  Right Units Number of sites        Sternocleidomastoid  60 60 1  Splenius Capitus, posterior approach 100  100 1  Splenius Capitus, lateral approach 40  40 1  Levator Scapulae      Trapezius  20/20/20 60 3        TOTAL UNITS:   260    Agent: Botulinum Type A ( Onobotulinum Toxin type A ).  3 vials of Botox were used, each containing 100 units and freshly diluted with 1 mL of sterile, non-preserved saline   Total injected (Units): 260  Total wasted (Units):40   Pt tolerated procedure well without complications.   Reinjection is anticipated in 3 months.

## 2022-10-05 ENCOUNTER — Telehealth: Payer: Self-pay | Admitting: Pharmacy Technician

## 2022-10-05 NOTE — Telephone Encounter (Signed)
Unable to locate Botox PA dates in the chart. Can you please conduct 2024 BIV?   Thanks!

## 2022-10-05 NOTE — Telephone Encounter (Signed)
Appt 10/20/22

## 2022-10-20 ENCOUNTER — Ambulatory Visit: Payer: Managed Care, Other (non HMO) | Admitting: Neurology

## 2022-10-20 DIAGNOSIS — G243 Spasmodic torticollis: Secondary | ICD-10-CM

## 2022-10-20 MED ORDER — ONABOTULINUMTOXINA 100 UNITS IJ SOLR
300.0000 [IU] | Freq: Once | INTRAMUSCULAR | Status: AC
  Administered 2022-10-20: 260 [IU] via INTRAMUSCULAR

## 2022-10-20 NOTE — Procedures (Signed)
[facility]   Procedure Note Botox  Attending: Dr. Wells Guiles Craven Crean  Preoperative Diagnosis(es): Cervical Dystonia  Result History  Doing well.  Had pain after last injections but injections do help  Consent obtained from: The patient Benefits discussed included, but were not limited to decreased muscle tightness, increased joint range of motion, and decreased pain.  Risk discussed included, but were not limited pain and discomfort, bleeding, bruising, excessive weakness, venous thrombosis, muscle atrophy and dysphagia.  A copy of the patient medication guide was given to the patient which explains the blackbox warning.  Patients identity and treatment sites confirmed Yes.  .  Details of Procedure: Skin was cleaned with alcohol.   Prior to injection, the needle plunger was aspirated to make sure the needle was not within a blood vessel.  There was no blood retrieved on aspiration.    Following is a summary of the muscles injected  And the amount of Botulinum toxin used:   Dilution 0.9% preservative free saline mixed with 100 u Botox type A to make 10 U per 0.1cc  Injections  Location Left  Right Units Number of sites        Sternocleidomastoid  60 60 1  Splenius Capitus, posterior approach 100  100 1  Splenius Capitus, lateral approach 40  40 1  Levator Scapulae      Trapezius  20/20/20 60 3        TOTAL UNITS:   260    Agent: Botulinum Type A ( Onobotulinum Toxin type A ).  3 vials of Botox were used, each containing 100 units and freshly diluted with 1 mL of sterile, non-preserved saline   Total injected (Units): 260  Total wasted (Units):40   Pt tolerated procedure well without complications.   Reinjection is anticipated in 3 months.

## 2022-10-31 ENCOUNTER — Encounter: Payer: Managed Care, Other (non HMO) | Admitting: Internal Medicine

## 2022-12-01 ENCOUNTER — Encounter: Payer: Managed Care, Other (non HMO) | Admitting: Internal Medicine

## 2022-12-15 ENCOUNTER — Telehealth: Payer: Self-pay | Admitting: Pharmacy Technician

## 2022-12-15 ENCOUNTER — Other Ambulatory Visit (HOSPITAL_COMMUNITY): Payer: Self-pay

## 2022-12-15 NOTE — Telephone Encounter (Signed)
Botox[employer] verification has been submitted. Benefit Verification:   [place] PA has been submitted for BOTOX via [employer]. INSURANCE: MEDIMPACT DATE SUBMITTED: 3.22.24 KEY: [id number]  Status is pending

## 2022-12-26 ENCOUNTER — Encounter: Payer: Self-pay | Admitting: Neurology

## 2022-12-26 NOTE — Telephone Encounter (Signed)
Pharmacy Patient Advocate Encounter- Botox BIV-Medical Benefit:  J code: [id number] CPT code: [phone] Dx Code: G24.3  PA was submitted to [facility] and has been DENIED through [place]. Reason for denial is: When used for the treatment of cervical dystonia, our guideline named ONABOTULINUM TOXIN A (Botox) requires that you had a previous trial of two preferred medications: Dysport,Myobloc, or Xeomin, unless there is a medical reason why you cannot (contraindication to).   We currently do not have a pharmacist to submit an appeal. If you would like to appeal the decision you can call [phone].   PA is not needed for Medical benefits. Please do McCoy send prescription to Specialty Pharmacy list below through and specify as medical benefits:   Estimated Patient cost is:

## 2023-01-01 NOTE — Telephone Encounter (Signed)
I submitted an appeal I don't know if it will help but hopefully

## 2023-01-03 ENCOUNTER — Other Ambulatory Visit: Payer: Self-pay | Admitting: Neurology

## 2023-01-03 DIAGNOSIS — G243 Spasmodic torticollis: Secondary | ICD-10-CM

## 2023-01-10 ENCOUNTER — Telehealth: Payer: Self-pay

## 2023-01-10 NOTE — Telephone Encounter (Signed)
Called and let [facility] know we are appealing the denial from the patients insurance company. I sent it on 12/28/22

## 2023-01-10 NOTE — Telephone Encounter (Signed)
[facility] is calling in asking for a call back about a PA for patients botox.

## 2023-01-11 NOTE — Telephone Encounter (Signed)
Called and scheduled for the 23rd

## 2023-01-11 NOTE — Telephone Encounter (Signed)
[facility] called to schedule a delivery. Asked for a call back.

## 2023-01-19 ENCOUNTER — Ambulatory Visit: Payer: Managed Care, Other (non HMO) | Admitting: Neurology

## 2023-01-19 DIAGNOSIS — G243 Spasmodic torticollis: Secondary | ICD-10-CM | POA: Diagnosis not present

## 2023-01-19 MED ORDER — ONABOTULINUMTOXINA 100 UNITS IJ SOLR
300.0000 [IU] | Freq: Once | INTRAMUSCULAR | Status: AC
  Administered 2023-01-19: 260 [IU] via INTRAMUSCULAR

## 2023-01-19 NOTE — Procedures (Signed)
[facility]   Procedure Note Botox  Attending: Dr. Lurena Joiner Qusay Villada  Preoperative Diagnosis(es): Cervical Dystonia  Result History  Noting some pull  Consent obtained from: The patient Benefits discussed included, but were not limited to decreased muscle tightness, increased joint range of motion, and decreased pain.  Risk discussed included, but were not limited pain and discomfort, bleeding, bruising, excessive weakness, venous thrombosis, muscle atrophy and dysphagia.  A copy of the patient medication guide was given to the patient which explains the blackbox warning.  Patients identity and treatment sites confirmed Yes.  .  Details of Procedure: Skin was cleaned with alcohol.   Prior to injection, the needle plunger was aspirated to make sure the needle was not within a blood vessel.  There was no blood retrieved on aspiration.    Following is a summary of the muscles injected  And the amount of Botulinum toxin used:   Dilution 0.9% preservative free saline mixed with 100 u Botox type A to make 10 U per 0.1cc  Injections  Location Left  Right Units Number of sites        Sternocleidomastoid  60 60 1  Splenius Capitus, posterior approach 100  100 1  Splenius Capitus, lateral approach 40  40 1  Levator Scapulae      Trapezius  20/20/20 60 3        TOTAL UNITS:   260    Agent: Botulinum Type A ( Onobotulinum Toxin type A ).  3 vials of Botox were used, each containing 100 units and freshly diluted with 1 mL of sterile, non-preserved saline   Total injected (Units): 260  Total wasted (Units):40   Pt tolerated procedure well without complications.   Reinjection is anticipated in 3 months.

## 2023-02-08 ENCOUNTER — Other Ambulatory Visit: Payer: Self-pay | Admitting: Internal Medicine

## 2023-02-08 ENCOUNTER — Telehealth: Payer: Self-pay

## 2023-02-08 DIAGNOSIS — E042 Nontoxic multinodular goiter: Secondary | ICD-10-CM

## 2023-02-08 NOTE — Telephone Encounter (Signed)
Done. C 

## 2023-02-08 NOTE — Telephone Encounter (Signed)
Pt called requesting a referral to a [facility] Endocrinologist. [facility] Dr. Carole Civil Ph: [phone]

## 2023-02-09 ENCOUNTER — Encounter: Payer: Self-pay | Admitting: Internal Medicine

## 2023-04-20 ENCOUNTER — Ambulatory Visit: Payer: Managed Care, Other (non HMO) | Admitting: Neurology

## 2023-04-20 DIAGNOSIS — G243 Spasmodic torticollis: Secondary | ICD-10-CM

## 2023-04-20 MED ORDER — ONABOTULINUMTOXINA 100 UNITS IJ SOLR
300.0000 [IU] | Freq: Once | INTRAMUSCULAR | Status: AC
Start: 2023-04-20 — End: 2023-04-20
  Administered 2023-04-20: 260 [IU] via INTRAMUSCULAR

## 2023-04-20 NOTE — Procedures (Signed)
[facility]   Procedure Note Botox  Attending: Dr. Lurena Joiner Robby Pirani  Preoperative Diagnosis(es): Cervical Dystonia  Result History  Botox is helping  Consent obtained from: The patient Benefits discussed included, but were not limited to decreased muscle tightness, increased joint range of motion, and decreased pain.  Risk discussed included, but were not limited pain and discomfort, bleeding, bruising, excessive weakness, venous thrombosis, muscle atrophy and dysphagia.  A copy of the patient medication guide was given to the patient which explains the blackbox warning.  Patients identity and treatment sites confirmed Yes.  .  Details of Procedure: Skin was cleaned with alcohol.   Prior to injection, the needle plunger was aspirated to make sure the needle was not within a blood vessel.  There was no blood retrieved on aspiration.    Following is a summary of the muscles injected  And the amount of Botulinum toxin used:   Dilution 0.9% preservative free saline mixed with 100 u Botox type A to make 10 U per 0.1cc  Injections  Location Left  Right Units Number of sites        Sternocleidomastoid  60 60 1  Splenius Capitus, posterior approach 100  100 1  Splenius Capitus, lateral approach 40  40 1  Levator Scapulae      Trapezius  20/20/20 60 3        TOTAL UNITS:   260    Agent: Botulinum Type A ( Onobotulinum Toxin type A ).  3 vials of Botox were used, each containing 100 units and freshly diluted with 1 mL of sterile, non-preserved saline   Total injected (Units): 260  Total wasted (Units):40   Pt tolerated procedure well without complications.   Reinjection is anticipated in 3 months.

## 2023-07-20 ENCOUNTER — Ambulatory Visit: Payer: Managed Care, Other (non HMO) | Admitting: Neurology

## 2023-07-20 DIAGNOSIS — G243 Spasmodic torticollis: Secondary | ICD-10-CM

## 2023-07-20 MED ORDER — ONABOTULINUMTOXINA 100 UNITS IJ SOLR
300.0000 [IU] | Freq: Once | INTRAMUSCULAR | Status: AC
Start: 2023-07-20 — End: 2023-07-20
  Administered 2023-07-20: 260 [IU] via INTRAMUSCULAR

## 2023-07-20 NOTE — Procedures (Signed)
[facility]   Procedure Note Botox  Attending: Dr. Lurena Joiner Shanvi Moyd  Preoperative Diagnosis(es): Cervical Dystonia  Result History  Botox is helping.  Notes head tremor if stressful day  Consent obtained from: The patient Benefits discussed included, but were not limited to decreased muscle tightness, increased joint range of motion, and decreased pain.  Risk discussed included, but were not limited pain and discomfort, bleeding, bruising, excessive weakness, venous thrombosis, muscle atrophy and dysphagia.  A copy of the patient medication guide was given to the patient which explains the blackbox warning.  Patients identity and treatment sites confirmed Yes.  .  Details of Procedure: Skin was cleaned with alcohol.   Prior to injection, the needle plunger was aspirated to make sure the needle was not within a blood vessel.  There was no blood retrieved on aspiration.    Following is a summary of the muscles injected  And the amount of Botulinum toxin used:   Dilution 0.9% preservative free saline mixed with 100 u Botox type A to make 10 U per 0.1cc  Injections  Location Left  Right Units Number of sites        Sternocleidomastoid  60 60 1  Splenius Capitus, posterior approach 100  100 1  Splenius Capitus, lateral approach 40  40 1  Levator Scapulae      Trapezius  20/20/20 60 3        TOTAL UNITS:   260    Agent: Botulinum Type A ( Onobotulinum Toxin type A ).  3 vials of Botox were used, each containing 100 units and freshly diluted with 1 mL of sterile, non-preserved saline   Total injected (Units): 260  Total wasted (Units):40   Pt tolerated procedure well without complications.   Reinjection is anticipated in 3 months.

## 2023-10-01 ENCOUNTER — Encounter: Payer: Self-pay | Admitting: Neurology

## 2023-10-03 ENCOUNTER — Other Ambulatory Visit (HOSPITAL_COMMUNITY): Payer: Self-pay

## 2023-10-03 ENCOUNTER — Telehealth: Payer: Self-pay | Admitting: Pharmacy Technician

## 2023-10-03 NOTE — Telephone Encounter (Signed)
 Pharmacy Patient Advocate Encounter  Received notification from [facility] that Prior Authorization for BOTOX 100 has been CANCELLED due to    PA #/Case ID/Reference #: [id number]

## 2023-10-03 NOTE — Telephone Encounter (Signed)
 Pharmacy Patient Advocate Encounter  BotoxOne verification has been submitted. Benefit Verification #:  [id number]  Pharmacy PA has been submitted for BOTOX  100u via CoverMyMeds. INSURANCE: MEDIMPACT DATE SUBMITTED: 1.8.25 KEY: [id number] Status is pending

## 2023-10-04 ENCOUNTER — Other Ambulatory Visit (HOSPITAL_COMMUNITY): Payer: Self-pay

## 2023-10-04 ENCOUNTER — Other Ambulatory Visit: Payer: Self-pay

## 2023-10-04 DIAGNOSIS — G243 Spasmodic torticollis: Secondary | ICD-10-CM

## 2023-10-04 MED ORDER — BOTOX 100 UNITS IJ SOLR: 100.0000 [IU] | INTRAMUSCULAR | 3 refills | Status: DC

## 2023-10-12 ENCOUNTER — Telehealth: Payer: Self-pay

## 2023-10-12 NOTE — Telephone Encounter (Signed)
Called patient pharmacy for delivery of Botox-per rep delivery has been scheduled to office for 10/17/2023

## 2023-10-19 ENCOUNTER — Ambulatory Visit: Payer: Managed Care, Other (non HMO) | Admitting: Neurology

## 2023-10-19 ENCOUNTER — Encounter: Payer: Self-pay | Admitting: Neurology

## 2023-10-19 DIAGNOSIS — G243 Spasmodic torticollis: Secondary | ICD-10-CM | POA: Diagnosis not present

## 2023-10-19 MED ORDER — ONABOTULINUMTOXINA 100 UNITS IJ SOLR
300.0000 [IU] | Freq: Once | INTRAMUSCULAR | Status: AC
Start: 2023-10-19 — End: 2023-10-19
  Administered 2023-10-19: 260 [IU] via INTRAMUSCULAR

## 2023-10-19 NOTE — Procedures (Signed)
[facility]   Procedure Note Botox  Attending: Dr. Lurena Joiner Deckard Stuber  Preoperative Diagnosis(es): Cervical Dystonia  Result History  stable  Consent obtained from: The patient Benefits discussed included, but were not limited to decreased muscle tightness, increased joint range of motion, and decreased pain.  Risk discussed included, but were not limited pain and discomfort, bleeding, bruising, excessive weakness, venous thrombosis, muscle atrophy and dysphagia.  A copy of the patient medication guide was given to the patient which explains the blackbox warning.  Patients identity and treatment sites confirmed Yes.  .  Details of Procedure: Skin was cleaned with alcohol.   Prior to injection, the needle plunger was aspirated to make sure the needle was not within a blood vessel.  There was no blood retrieved on aspiration.    Following is a summary of the muscles injected  And the amount of Botulinum toxin used:   Dilution 0.9% preservative free saline mixed with 100 u Botox type A to make 10 U per 0.1cc  Injections  Location Left  Right Units Number of sites        Sternocleidomastoid  60 60 1  Splenius Capitus, posterior approach 100  100 1  Splenius Capitus, lateral approach 40  40 1  Levator Scapulae      Trapezius  20/20/20 60 3        TOTAL UNITS:   260    Agent: Botulinum Type A ( Onobotulinum Toxin type A ).  3 vials of Botox were used, each containing 100 units and freshly diluted with 1 mL of sterile, non-preserved saline   Total injected (Units): 260  Total wasted (Units):40   Pt tolerated procedure well without complications.   Reinjection is anticipated in 3 months.

## 2023-10-28 ENCOUNTER — Encounter: Payer: Self-pay | Admitting: Neurology

## 2024-01-02 ENCOUNTER — Telehealth: Payer: Self-pay | Admitting: Pharmacy Technician

## 2024-01-02 ENCOUNTER — Other Ambulatory Visit (HOSPITAL_COMMUNITY): Payer: Self-pay

## 2024-01-02 ENCOUNTER — Telehealth: Payer: Self-pay

## 2024-01-02 NOTE — Telephone Encounter (Signed)
 Pharmacy Patient Advocate Encounter   Received notification from Pt Calls Messages that prior authorization for BOTOX 100 is required/requested.   Insurance verification completed.   The patient is insured through [facility] .   Per test claim: PA required; PA submitted to above mentioned insurance via CoverMyMeds Key/confirmation #/EOC [id number] Status is pending

## 2024-01-02 NOTE — Telephone Encounter (Signed)
 PA has been submitted, and telephone encounter has been created. Please see telephone encounter dated 4.9.25.

## 2024-01-02 NOTE — Telephone Encounter (Signed)
 Spoke to pharmacy and this patient needs a new PA for Botox 300 units please

## 2024-01-04 ENCOUNTER — Other Ambulatory Visit (HOSPITAL_COMMUNITY): Payer: Self-pay

## 2024-01-04 NOTE — Telephone Encounter (Signed)
 Pharmacy Patient Advocate Encounter- Injection via Pharmacy Benefit:  PA was submitted  for Botox- [phone] to [facility] and has been approved through: 4.10.25 TO 4.926 Authorization# [id number]  Please send prescription to Specialty Pharmacy: [facility] Estimated Pharmacy Copay is: ?  Patient IS eligible for Botox- X9147 Copay Card, which will make patient's copay as little as zero. Copay card will be provided to pharmacy.   Admin Code: 82956   does not require Prior Auth. Patient estimated coinsurance: %25 Patient's remaining deductible: $0

## 2024-01-18 ENCOUNTER — Ambulatory Visit: Payer: Managed Care, Other (non HMO) | Admitting: Neurology

## 2024-01-18 DIAGNOSIS — G243 Spasmodic torticollis: Secondary | ICD-10-CM

## 2024-01-18 MED ORDER — ONABOTULINUMTOXINA 100 UNITS IJ SOLR
300.0000 [IU] | Freq: Once | INTRAMUSCULAR | Status: AC
Start: 2024-01-18 — End: 2024-01-18
  Administered 2024-01-18: 260 [IU] via INTRAMUSCULAR

## 2024-01-18 NOTE — Progress Notes (Signed)
 [facility]   Procedure Note Botox   Attending: Dr. Ivette Marks Oney Tatlock  Preoperative Diagnosis(es): Cervical Dystonia  Result History  Pleased with botox  results.  Wearing off last few weeks and had to use ice but otherwise good  Consent obtained from: The patient Benefits discussed included, but were not limited to decreased muscle tightness, increased joint range of motion, and decreased pain.  Risk discussed included, but were not limited pain and discomfort, bleeding, bruising, excessive weakness, venous thrombosis, muscle atrophy and dysphagia.  A copy of the patient medication guide was given to the patient which explains the blackbox warning.  Patients identity and treatment sites confirmed Yes.  .  Details of Procedure: Skin was cleaned with alcohol.   Prior to injection, the needle plunger was aspirated to make sure the needle was not within a blood vessel.  There was no blood retrieved on aspiration.    Following is a summary of the muscles injected  And the amount of Botulinum toxin used:   Dilution 0.9% preservative free saline mixed with 100 u Botox  type A to make 10 U per 0.1cc  Injections  Location Left  Right Units Number of sites        Sternocleidomastoid  60 60 1  Splenius Capitus, posterior approach 100  100 1  Splenius Capitus, lateral approach 40  40 1  Levator Scapulae      Trapezius  20/20/20 60 3        TOTAL UNITS:   260    Agent: Botulinum Type A ( Onobotulinum Toxin type A ).  3 vials of Botox  were used, each containing 100 units and freshly diluted with 1 mL of sterile, non-preserved saline   Total injected (Units): 260  Total wasted (Units):40   Pt tolerated procedure well without complications.   Reinjection is anticipated in 3 months.

## 2024-04-18 ENCOUNTER — Ambulatory Visit: Admitting: Neurology

## 2024-04-18 DIAGNOSIS — G243 Spasmodic torticollis: Secondary | ICD-10-CM | POA: Diagnosis not present

## 2024-04-18 MED ORDER — ONABOTULINUMTOXINA 100 UNITS IJ SOLR
300.0000 [IU] | Freq: Once | INTRAMUSCULAR | Status: AC
Start: 2024-04-18 — End: 2024-04-18
  Administered 2024-04-18: 260 [IU] via INTRAMUSCULAR

## 2024-04-18 NOTE — Procedures (Signed)
 [facility]   Procedure Note Botox   Attending: Dr. Asberry Rand Boller  Preoperative Diagnosis(es): Cervical Dystonia  Result History  Doing well with botox   Consent obtained from: The patient Benefits discussed included, but were not limited to decreased muscle tightness, increased joint range of motion, and decreased pain.  Risk discussed included, but were not limited pain and discomfort, bleeding, bruising, excessive weakness, venous thrombosis, muscle atrophy and dysphagia.  A copy of the patient medication guide was given to the patient which explains the blackbox warning.  Patients identity and treatment sites confirmed Yes.  .  Details of Procedure: Skin was cleaned with alcohol.   Prior to injection, the needle plunger was aspirated to make sure the needle was not within a blood vessel.  There was no blood retrieved on aspiration.    Following is a summary of the muscles injected  And the amount of Botulinum toxin used:   Dilution 0.9% preservative free saline mixed with 100 u Botox  type A to make 10 U per 0.1cc  Injections  Location Left  Right Units Number of sites        Sternocleidomastoid  60 60 1  Splenius Capitus, posterior approach 100  100 1  Splenius Capitus, lateral approach 40  40 1  Levator Scapulae      Trapezius  20/20/20 60 3        TOTAL UNITS:   260    Agent: Botulinum Type A ( Onobotulinum Toxin type A ).  3 vials of Botox  were used, each containing 100 units and freshly diluted with 1 mL of sterile, non-preserved saline   Total injected (Units): 260  Total wasted (Units):40   Pt tolerated procedure well without complications.   Reinjection is anticipated in 3 months.

## 2024-07-25 ENCOUNTER — Ambulatory Visit: Admitting: Neurology

## 2024-07-25 DIAGNOSIS — G243 Spasmodic torticollis: Secondary | ICD-10-CM | POA: Diagnosis not present

## 2024-07-25 MED ORDER — ONABOTULINUMTOXINA 100 UNITS IJ SOLR
300.0000 [IU] | Freq: Once | INTRAMUSCULAR | Status: AC
Start: 2024-07-25 — End: 2024-07-25
  Administered 2024-07-25: 260 [IU] via INTRAMUSCULAR

## 2024-07-25 NOTE — Procedures (Signed)
 [facility]   Procedure Note Botox   Attending: Dr. Asberry Brandyn Lowrey  Preoperative Diagnosis(es): Cervical Dystonia  Result History  Doing well with botox ; can feel wearing off but no pain  Consent obtained from: The patient Benefits discussed included, but were not limited to decreased muscle tightness, increased joint range of motion, and decreased pain.  Risk discussed included, but were not limited pain and discomfort, bleeding, bruising, excessive weakness, venous thrombosis, muscle atrophy and dysphagia.  A copy of the patient medication guide was given to the patient which explains the blackbox warning.  Patients identity and treatment sites confirmed Yes.  .  Details of Procedure: Skin was cleaned with alcohol.   Prior to injection, the needle plunger was aspirated to make sure the needle was not within a blood vessel.  There was no blood retrieved on aspiration.    Following is a summary of the muscles injected  And the amount of Botulinum toxin used:   Dilution 0.9% preservative free saline mixed with 100 u Botox  type A to make 10 U per 0.1cc  Injections  Location Left  Right Units Number of sites        Sternocleidomastoid  60 60 1  Splenius Capitus, posterior approach 100  100 1  Splenius Capitus, lateral approach 40  40 1  Levator Scapulae      Trapezius  20/20/20 60 3        TOTAL UNITS:   260    Agent: Botulinum Type A ( Onobotulinum Toxin type A ).  3 vials of Botox  were used, each containing 100 units and freshly diluted with 1 mL of sterile, non-preserved saline   Total injected (Units): 260  Total wasted (Units):40   Pt tolerated procedure well without complications.   Reinjection is anticipated in 3 months.

## 2024-09-19 ENCOUNTER — Encounter: Payer: Self-pay | Admitting: Neurology

## 2024-09-22 NOTE — Telephone Encounter (Signed)
 Does this coverage start 2026? If so I will not be able to start the PA until the new year.

## 2024-09-23 NOTE — Telephone Encounter (Signed)
 Yes it is a 2026 plan thank you for letting me know. I have so many patients that are giving me new insurance for the upcoming year

## 2024-10-05 ENCOUNTER — Other Ambulatory Visit: Payer: Self-pay | Admitting: Neurology

## 2024-10-05 DIAGNOSIS — G243 Spasmodic torticollis: Secondary | ICD-10-CM

## 2024-10-06 ENCOUNTER — Other Ambulatory Visit (HOSPITAL_COMMUNITY): Payer: Self-pay

## 2024-10-06 ENCOUNTER — Telehealth: Payer: Self-pay | Admitting: Pharmacy Technician

## 2024-10-06 NOTE — Telephone Encounter (Signed)
 Pharmacy Patient Advocate Encounter   Received notification from Patient Advice Request messages that prior authorization for BOTOX  100 is required/requested.   Insurance verification completed.   The patient is insured through [employer].   Per test claim: PA required; PA submitted to above mentioned insurance via Latent Key/confirmation #/EOC [id number] Status is pending

## 2024-10-06 NOTE — Telephone Encounter (Signed)
 PA has been submitted, and telephone encounter has been created. Please see telephone encounter dated 1.12.26.

## 2024-10-08 ENCOUNTER — Encounter: Payer: Self-pay | Admitting: Neurology

## 2024-10-09 NOTE — Telephone Encounter (Signed)
 Pharmacy Patient Advocate Encounter  Received notification from [facility] that Prior Authorization for BOTOX  200 has been DENIED.  Full denial letter will be uploaded to the media tab. See denial reason below.    PA #/Case ID/Reference #: [id number]  Botox  One Benefit Verification [id number] Submitted

## 2024-10-09 NOTE — Telephone Encounter (Signed)
 Please see update encounter information regarding denial reasoning.

## 2024-10-13 ENCOUNTER — Telehealth: Payer: Self-pay | Admitting: Neurology

## 2024-10-13 NOTE — Telephone Encounter (Signed)
 [employer] Location for [employer] called to get update on Botox  PA, showing denied by Pharm benefits and [facility] will not be able to fill Rx Botox  order if Pharm benefits are not being used, Provider will need to contact another pharmacy for Rx Botox  if Approved under medica benefits

## 2024-10-14 NOTE — Telephone Encounter (Signed)
 BotoxOne is still pending. According to the site. Delays should be expected this month. Will call the insurance tomorrow to check.

## 2024-10-15 ENCOUNTER — Other Ambulatory Visit (HOSPITAL_COMMUNITY): Payer: Self-pay

## 2024-10-15 NOTE — Telephone Encounter (Signed)
 Called and spoke with [employer]. Call Reference [phone]. Patient is denied under pharmacy benefits, however no PA or precert needed for CPT code 35383, [phone], or [phone] through buy and bill OR medical benefits as long as it dispensed directly from the pharmacy to the office. The two pharmacies that it can be dispensed from are [facility] specialty pharmacy and [facility].

## 2024-10-24 ENCOUNTER — Ambulatory Visit: Admitting: Neurology

## 2024-10-24 DIAGNOSIS — G243 Spasmodic torticollis: Secondary | ICD-10-CM | POA: Diagnosis not present

## 2024-10-24 MED ORDER — ONABOTULINUMTOXINA 100 UNITS IJ SOLR
300.0000 [IU] | Freq: Once | INTRAMUSCULAR | Status: AC
  Administered 2024-10-24: 260 [IU] via INTRAMUSCULAR

## 2024-10-24 NOTE — Procedures (Signed)
 [facility]   Procedure Note Botox   Attending: Dr. Asberry Trendon Zaring  Preoperative Diagnosis(es): Cervical Dystonia  Result History  Doing well with botox ; discussed potential changes to botox  MCR rules, including increased documentation/rating scales.  Unclear if her dosage will require appeal.  Goes into effect in 1 month  Consent obtained from: The patient Benefits discussed included, but were not limited to decreased muscle tightness, increased joint range of motion, and decreased pain.  Risk discussed included, but were not limited pain and discomfort, bleeding, bruising, excessive weakness, venous thrombosis, muscle atrophy and dysphagia.  A copy of the patient medication guide was given to the patient which explains the blackbox warning.  Patients identity and treatment sites confirmed Yes.  .  Details of Procedure: Skin was cleaned with alcohol.   Prior to injection, the needle plunger was aspirated to make sure the needle was not within a blood vessel.  There was no blood retrieved on aspiration.    Following is a summary of the muscles injected  And the amount of Botulinum toxin used:   Dilution 0.9% preservative free saline mixed with 100 u Botox  type A to make 10 U per 0.1cc  Injections  Location Left  Right Units Number of sites        Sternocleidomastoid  60 60 1  Splenius Capitus, posterior approach 100  100 1  Splenius Capitus, lateral approach 40  40 1  Levator Scapulae      Trapezius  20/20/20 60 3        TOTAL UNITS:   260    Agent: Botulinum Type A ( Onobotulinum Toxin type A ).  3 vials of Botox  were used, each containing 100 units and freshly diluted with 1 mL of sterile, non-preserved saline   Total injected (Units): 260  Total wasted (Units):40   Pt tolerated procedure well without complications.   Reinjection is anticipated in 3 months.

## 2025-01-23 ENCOUNTER — Ambulatory Visit: Payer: Self-pay | Admitting: Neurology

## 2025-01-23 ENCOUNTER — Ambulatory Visit: Admitting: Neurology
